# Patient Record
Sex: Female | Born: 1955 | Race: Black or African American | Hispanic: No | Marital: Single | Smoking: Never smoker
Health system: Southern US, Community
[De-identification: ages and names within clinical notes are randomized; demographics above are authoritative.]

## PROBLEM LIST (undated history)

## (undated) DIAGNOSIS — J45909 Unspecified asthma, uncomplicated: Secondary | ICD-10-CM

## (undated) DIAGNOSIS — E669 Obesity, unspecified: Secondary | ICD-10-CM

## (undated) DIAGNOSIS — I10 Essential (primary) hypertension: Secondary | ICD-10-CM

## (undated) HISTORY — PX: KNEE SURGERY: SHX244

## (undated) HISTORY — PX: OTHER SURGICAL HISTORY: SHX169

---

## 2015-10-29 ENCOUNTER — Encounter (HOSPITAL_COMMUNITY): Payer: Self-pay | Admitting: Emergency Medicine

## 2015-10-29 DIAGNOSIS — Z23 Encounter for immunization: Secondary | ICD-10-CM | POA: Insufficient documentation

## 2015-10-29 DIAGNOSIS — E669 Obesity, unspecified: Secondary | ICD-10-CM | POA: Insufficient documentation

## 2015-10-29 DIAGNOSIS — H6012 Cellulitis of left external ear: Secondary | ICD-10-CM | POA: Insufficient documentation

## 2015-10-29 DIAGNOSIS — I10 Essential (primary) hypertension: Secondary | ICD-10-CM | POA: Insufficient documentation

## 2015-10-29 DIAGNOSIS — J45909 Unspecified asthma, uncomplicated: Secondary | ICD-10-CM | POA: Insufficient documentation

## 2015-10-29 DIAGNOSIS — H6002 Abscess of left external ear: Secondary | ICD-10-CM | POA: Insufficient documentation

## 2015-10-29 NOTE — ED Notes (Signed)
Pt. reports left ear lobe redness/swelling with pain onset Sunday with no drainage , pt. stated ear pierced last Friday .

## 2015-10-30 ENCOUNTER — Emergency Department (HOSPITAL_COMMUNITY)
Admission: EM | Admit: 2015-10-30 | Discharge: 2015-10-30 | Disposition: A | Discharge status: Home / Self Care | Payer: Self-pay | Attending: Emergency Medicine | Admitting: Emergency Medicine

## 2015-10-30 DIAGNOSIS — L0291 Cutaneous abscess, unspecified: Secondary | ICD-10-CM

## 2015-10-30 DIAGNOSIS — L0390 Cellulitis, unspecified: Secondary | ICD-10-CM

## 2015-10-30 HISTORY — DX: Unspecified asthma, uncomplicated: J45.909

## 2015-10-30 HISTORY — DX: Obesity, unspecified: E66.9

## 2015-10-30 HISTORY — DX: Essential (primary) hypertension: I10

## 2015-10-30 MED ORDER — HYDROCODONE-ACETAMINOPHEN 5-325 MG PO TABS: 1.0000 | ORAL_TABLET | Freq: Four times a day (QID) | ORAL | Status: DC | PRN

## 2015-10-30 MED ORDER — CEPHALEXIN 500 MG PO CAPS: 500.0000 mg | ORAL_CAPSULE | Freq: Four times a day (QID) | ORAL | Status: DC

## 2015-10-30 MED ORDER — IBUPROFEN 800 MG PO TABS: 800.0000 mg | ORAL_TABLET | Freq: Three times a day (TID) | ORAL | Status: DC | PRN

## 2015-10-30 MED ORDER — TETANUS-DIPHTH-ACELL PERTUSSIS 5-2.5-18.5 LF-MCG/0.5 IM SUSP
0.5000 mL | Freq: Once | INTRAMUSCULAR | Status: AC
  Administered 2015-10-30: 0.5 mL via INTRAMUSCULAR
  Filled 2015-10-30: qty 0.5

## 2015-10-30 MED ORDER — CEPHALEXIN 250 MG PO CAPS
500.0000 mg | ORAL_CAPSULE | Freq: Once | ORAL | Status: AC
  Administered 2015-10-30: 500 mg via ORAL
  Filled 2015-10-30: qty 2

## 2015-10-30 MED ORDER — IBUPROFEN 800 MG PO TABS
800.0000 mg | ORAL_TABLET | Freq: Once | ORAL | Status: AC
  Administered 2015-10-30: 800 mg via ORAL
  Filled 2015-10-30: qty 1

## 2015-10-30 NOTE — ED Provider Notes (Signed)
TIME SEEN: 5:35 AM  CHIEF COMPLAINT: Left earlobe swelling and pain  HPI: Pt is a 60 y.o. female with history of hypertension and asthma who presents to the emergency department with left earlobe swelling for the past couple of days. He went to [facility] and had her ear pierced  6 days ago. States that the technician used sterile equipment. States that she has no sober the past 2-3 days at her earlobe has been swollen, red and painful. Denies any drainage. No fevers. She is not a diabetic immunocompromised. No pain inside the ear drainage in severe. No changes in her hearing. Pain and swelling is localized to the lobe around the area of the piercing. She has removed the piercing. States she is not sure when her last tetanus vaccination was.  ROS: See HPI Constitutional: no fever  Eyes: no drainage  ENT: no runny nose   Cardiovascular:  no chest pain  Resp: no SOB  GI: no vomiting GU: no dysuria Integumentary: no rash  Allergy: no hives  Musculoskeletal: no leg swelling  Neurological: no slurred speech ROS otherwise negative  PAST MEDICAL HISTORY/PAST SURGICAL HISTORY:  Past Medical History  Diagnosis Date  . Hypertension   . Asthma   . Obesity     MEDICATIONS:  Prior to Admission medications   Not on File    ALLERGIES:  No Known Allergies  SOCIAL HISTORY:  Social History  Substance Use Topics  . Smoking status: Never Smoker   . Smokeless tobacco: Not on file  . Alcohol Use: No    FAMILY HISTORY: No family history on file.  EXAM: BP 160/95 mmHg  Pulse 93  Temp(Src) 98.1 F (36.7 C) (Oral)  Resp 18  SpO2 100% CONSTITUTIONAL: Alert and oriented and responds appropriately to questions. Well-appearing; well-nourished HEAD: Normocephalic EYES: Conjunctivae clear, PERRL ENT: normal nose; no rhinorrhea; moist mucous membranes; patient has a swollen, erythematous and warm lobule of the auricle of the left ear with 3 piercings noted to the lobule.  The most medial  piercing does have yellow purulent drainage coming out of it. There is no fluctuance. No signs of mastoiditis. No drainage from the external auditory canal.  NECK: Supple, no meningismus, no LAD  CARD: RRR; S1 and S2 appreciated; no murmurs, no clicks, no rubs, no gallops RESP: Normal chest excursion without splinting or tachypnea; breath sounds clear and equal bilaterally; no wheezes, no rhonchi, no rales, no hypoxia or respiratory distress, speaking full sentences ABD/GI: Normal bowel sounds; non-distended; soft, non-tender, no rebound, no guarding, no peritoneal signs BACK:  The back appears normal and is non-tender to palpation, there is no CVA tenderness EXT: Normal ROM in all joints; non-tender to palpation; no edema; normal capillary refill; no cyanosis, no calf tenderness or swelling    SKIN: Normal color for age and race; warm; no rash NEURO: Moves all extremities equally, sensation to light touch intact diffusely, cranial nerves II through XII intact PSYCH: The patient's mood and manner are appropriate. Grooming and personal hygiene are appropriate.  MEDICAL DECISION MAKING: Patient here with infection to the lobule of the left ear. I was able to manually express a moderate amount of purulent drainage from the hole from the piercing in the left earlobe. She has had significant relief after this was performed. Have advised her to continue using warm compresses to this area as this was likely a small abscess and is now successfully drained. Will discharge on antibiotics for surrounding cellulitis.  We'll discharge with a short  course of pain medication as well. She does not have a PCP for follow-up. Have advised her of her swelling, redness or pain did not improve in next 48-72 hours she should return to the emergency department or urgent care for reevaluation. Discussed return precautions. She verbalized understanding and is comfortable with this plan. No sign of otitis externa, mastoiditis,  meningitis on exam. Doubt deep space neck infection.       [place], DO 10/30/15 [phone]

## 2015-10-30 NOTE — Discharge Instructions (Signed)

## 2021-02-24 ENCOUNTER — Institutional Professional Consult (permissible substitution): Payer: Self-pay | Admitting: Family Medicine

## 2021-04-28 ENCOUNTER — Encounter: Payer: Self-pay | Admitting: Family Medicine

## 2021-04-28 ENCOUNTER — Other Ambulatory Visit: Payer: Self-pay

## 2021-04-28 ENCOUNTER — Ambulatory Visit (INDEPENDENT_AMBULATORY_CARE_PROVIDER_SITE_OTHER): Payer: Medicare Other | Admitting: Family Medicine

## 2021-04-28 VITALS — BP 134/84 | HR 78 | Temp 98.5°F | Ht 62.5 in | Wt 319.2 lb

## 2021-04-28 DIAGNOSIS — Z9189 Other specified personal risk factors, not elsewhere classified: Secondary | ICD-10-CM

## 2021-04-28 DIAGNOSIS — L749 Eccrine sweat disorder, unspecified: Secondary | ICD-10-CM

## 2021-04-28 DIAGNOSIS — R600 Localized edema: Secondary | ICD-10-CM | POA: Diagnosis not present

## 2021-04-28 DIAGNOSIS — Z8249 Family history of ischemic heart disease and other diseases of the circulatory system: Secondary | ICD-10-CM

## 2021-04-28 DIAGNOSIS — Z1211 Encounter for screening for malignant neoplasm of colon: Secondary | ICD-10-CM

## 2021-04-28 DIAGNOSIS — G8929 Other chronic pain: Secondary | ICD-10-CM

## 2021-04-28 DIAGNOSIS — Z23 Encounter for immunization: Secondary | ICD-10-CM

## 2021-04-28 DIAGNOSIS — Z1231 Encounter for screening mammogram for malignant neoplasm of breast: Secondary | ICD-10-CM

## 2021-04-28 DIAGNOSIS — M25561 Pain in right knee: Secondary | ICD-10-CM

## 2021-04-28 DIAGNOSIS — R0600 Dyspnea, unspecified: Secondary | ICD-10-CM | POA: Diagnosis not present

## 2021-04-28 DIAGNOSIS — M25511 Pain in right shoulder: Secondary | ICD-10-CM

## 2021-04-28 DIAGNOSIS — R0609 Other forms of dyspnea: Secondary | ICD-10-CM

## 2021-04-28 DIAGNOSIS — M25562 Pain in left knee: Secondary | ICD-10-CM

## 2021-04-28 NOTE — Progress Notes (Signed)
Subjective:    Patient ID: Haley Stanley, female    DOB: [birth year], 65 y.o.   MRN: [id number]  HPI Chief Complaint  Patient presents with   other    New pt. Est. Rt. Knee arthritis and sweating with movement pt. Did agree to a flu shot today.    She is new to the practice and here publish care. Previous medical care: moved here from [place] 8 years ago.  Reports not having any medical care and more than 8 years.  Reports shortness of breath with walking and other activities.  This has been ongoing for at least 3 years but recently worsening. States she is not able to be active due to dyspnea. Denies chest pain, palpitations, cough, orthopnea, LE edema.  States her weight has been consistent and she has not lost any weight.  Admits that her diet is poor and that she overeats.  She also drinks soda.  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 Bursitis in left shoulder  Managing ok but does not walk often due to pain.    Reports sweating that is out of proportion to her activities.  Occasional night sweats.   Reports being told that she most likely has sleep apnea after one of her knee surgeries.  States they reported apnea.  She is not aware if she snores or not.  She does have daytime fatigue.  Denies ever having a sleep study.  No fever, chills, dizziness, abdominal pain, N/V/D, urinary symptoms.  Social history: Lives with her niece, single, is not working, worked with handicap adults in the past, HS graduate.  Denies smoking, drinking alcohol, drug use  Diet: unhealthy and drinks soda  Excerise: none   Immunizations: Fully vaccinated with boosters for Covid   Health maintenance:   Mammogram: 8-8 years ago  Colonoscopy: never  Last Gynecological Exam: many years ago    Reviewed allergies, medications, past medical, surgical, family, and social history.    Review of Systems Pertinent positives and negatives in the history of present illness.     Objective:   Physical  Exam BP 134/84   Pulse 78   Temp 98.5 F (36.9 C)   Ht 5' 2.5" (1.588 m)   Wt (!) 319 lb 3.2 oz (144.8 kg)   BMI 57.45 kg/m  Alert and oriented and in no distress.  Neck is supple without adenopathy. Cardiac exam shows a regular rhythm without murmurs or gallops. Lungs are clear to auscultation.  Respirations unlabored.  Lower extremities with a pronounced sock line, no pitting edema.  Skin is warm and dry.  Normal mood.   Epworth sleepiness scale 10     Assessment & Plan:  Dyspnea on exertion - Plan: CBC with Differential/Platelet, Comprehensive metabolic panel, EKG [id number], TSH, T4, free, T3, Lipid panel, Brain natriuretic peptide, DG Chest 2 View -She is not in any acute distress. EKG unremarkable and read by myself and Dr. Redmond School. Chest x-ray ordered. Discussed that her dyspnea may be related to severe obesity and deconditioning.  I will check labs and rule out any underlying etiology including a BNP to look for evidence of heart failure.  She does have a family history of heart failure.  Morbid obesity ([place]) - Plan: Hemoglobin A1c, TSH, T4, free, T3, Lipid panel, Brain natriuretic peptide, DG Chest 2 View, Home sleep test -In-depth counseling on improving her diet by cutting back on portion sizes, carbohydrates, soda in order to lose even a small amount of weight.  Discussed setting  a small goal such as 5 pounds and then when she meets that set another goal.  Discussed potential long-term health consequences associated with obesity. Follow-up pending lab results.  I also challenged her to lose 5 pounds and follow-up with me.  Bilateral leg edema - Plan: CBC with Differential/Platelet, Comprehensive metabolic panel, Brain natriuretic peptide, DG Chest 2 View -Nonpitting and most likely dependent.  Discussed increasing physical activity and walking more.  At increased risk for cardiovascular disease - Plan: EKG 12-Lead, Lipid panel, DG Chest 2 View -Discussed risk factors for heart  disease.  Follow-up pending chest x-ray and lab results.  At risk for sleep apnea - Plan: Home sleep test -She is at high risk for sleep apnea and has been told in the past that she had periods of apnea by healthcare workers during her knee surgery.  Epworth Sleepiness Scale of 10  Maternal family history of congestive heart failure - Plan: Brain natriuretic peptide, DG Chest 2 View  Sweating abnormality - Plan: TSH, T4, free, T3 -Check labs and follow-up  Chronic pain of both knees -Discussed even a small amount of weight loss will help improve knee pain.  History of arthritis.  Follow-up as needed.  Consider imaging or referral to Ortho as needed.  Chronic right shoulder pain -Continue symptomatic treatment.  Follow-up if worsening  Encounter for screening mammogram for malignant neoplasm of breast - Plan: MM DIGITAL SCREENING BILATERAL -Denies having a mammogram for at least 10 years and this would have been in [place].  Needs flu shot - Plan: Flu Vaccine QUAD High Dose(Fluad) -Counseling on potential side effects  Screen for colon cancer - Plan: Ambulatory referral to Gastroenterology -Denies ever having a colonoscopy.  Referral made

## 2021-04-28 NOTE — Patient Instructions (Signed)
Go to [facility] for a chest X ray.   I am referring you for a sleep test to screen for sleep apnea.   You will receive a call from [facility] to schedule a visit to discuss colon cancer screening.   Call [facility] and schedule your mammogram.   Cut back on soda, sweets, carbohydrates such as potatoes, pasta, rice, bread and sugar.   Cut back on portion sizes.   Avoid eating before bedtime.   We will be in touch with your lab results.

## 2021-04-29 ENCOUNTER — Encounter: Payer: Self-pay | Admitting: Family Medicine

## 2021-04-29 DIAGNOSIS — E785 Hyperlipidemia, unspecified: Secondary | ICD-10-CM | POA: Insufficient documentation

## 2021-04-29 DIAGNOSIS — E038 Other specified hypothyroidism: Secondary | ICD-10-CM | POA: Insufficient documentation

## 2021-04-29 DIAGNOSIS — E1169 Type 2 diabetes mellitus with other specified complication: Secondary | ICD-10-CM | POA: Insufficient documentation

## 2021-04-29 DIAGNOSIS — E119 Type 2 diabetes mellitus without complications: Secondary | ICD-10-CM | POA: Insufficient documentation

## 2021-04-29 HISTORY — DX: Other specified hypothyroidism: E03.8

## 2021-04-29 HISTORY — DX: Type 2 diabetes mellitus with other specified complication: E11.69

## 2021-04-29 HISTORY — DX: Type 2 diabetes mellitus without complications: E11.9

## 2021-04-29 LAB — CBC WITH DIFFERENTIAL/PLATELET
Basophils Absolute: 0.1 10*3/uL (ref 0.0–0.2)
Basos: 1 %
EOS (ABSOLUTE): 0.2 10*3/uL (ref 0.0–0.4)
Eos: 2 %
Hematocrit: 41.4 % (ref 34.0–46.6)
Hemoglobin: 14.7 g/dL (ref 11.1–15.9)
Immature Grans (Abs): 0 10*3/uL (ref 0.0–0.1)
Immature Granulocytes: 0 %
Lymphocytes Absolute: 3.1 10*3/uL (ref 0.7–3.1)
Lymphs: 34 %
MCH: 29.9 pg (ref 26.6–33.0)
MCHC: 35.5 g/dL (ref 31.5–35.7)
MCV: 84 fL (ref 79–97)
Monocytes Absolute: 0.4 10*3/uL (ref 0.1–0.9)
Monocytes: 4 %
Neutrophils Absolute: 5.6 10*3/uL (ref 1.4–7.0)
Neutrophils: 59 %
Platelets: 402 10*3/uL (ref 150–450)
RBC: 4.92 x10E6/uL (ref 3.77–5.28)
RDW: 13.1 % (ref 11.7–15.4)
WBC: 9.3 10*3/uL (ref 3.4–10.8)

## 2021-04-29 LAB — TSH: TSH: 4.54 u[IU]/mL — ABNORMAL HIGH (ref 0.450–4.500)

## 2021-04-29 LAB — LIPID PANEL
Chol/HDL Ratio: 3.4 ratio (ref 0.0–4.4)
Cholesterol, Total: 224 mg/dL — ABNORMAL HIGH (ref 100–199)
HDL: 65 mg/dL (ref >39)
LDL Chol Calc (NIH): 117 mg/dL — ABNORMAL HIGH (ref 0–99)
Triglycerides: 241 mg/dL — ABNORMAL HIGH (ref 0–149)
VLDL Cholesterol Cal: 42 mg/dL — ABNORMAL HIGH (ref 5–40)

## 2021-04-29 LAB — COMPREHENSIVE METABOLIC PANEL
ALT: 9 IU/L (ref 0–32)
AST: 12 IU/L (ref 0–40)
Albumin/Globulin Ratio: 1.3 (ref 1.2–2.2)
Albumin: 4.4 g/dL (ref 3.8–4.8)
Alkaline Phosphatase: 117 IU/L (ref 44–121)
BUN/Creatinine Ratio: 15 (ref 12–28)
BUN: 13 mg/dL (ref 8–27)
Bilirubin Total: 0.5 mg/dL (ref 0.0–1.2)
CO2: 20 mmol/L (ref 20–29)
Calcium: 9.9 mg/dL (ref 8.7–10.3)
Chloride: 100 mmol/L (ref 96–106)
Creatinine, Ser: 0.89 mg/dL (ref 0.57–1.00)
Globulin, Total: 3.4 g/dL (ref 1.5–4.5)
Glucose: 113 mg/dL — ABNORMAL HIGH (ref 65–99)
Potassium: 4.4 mmol/L (ref 3.5–5.2)
Sodium: 138 mmol/L (ref 134–144)
Total Protein: 7.8 g/dL (ref 6.0–8.5)
eGFR: 72 mL/min/{1.73_m2} (ref >59)

## 2021-04-29 LAB — HEMOGLOBIN A1C
Est. average glucose Bld gHb Est-mCnc: 151 mg/dL
Hgb A1c MFr Bld: 6.9 % — ABNORMAL HIGH (ref 4.8–5.6)

## 2021-04-29 LAB — T4, FREE: Free T4: 1.04 ng/dL (ref 0.82–1.77)

## 2021-04-29 LAB — BRAIN NATRIURETIC PEPTIDE: BNP: 10.3 pg/mL (ref 0.0–100.0)

## 2021-04-29 LAB — T3: T3, Total: 111 ng/dL (ref 71–180)

## 2021-04-29 NOTE — Progress Notes (Signed)
Let her know that she has diabetes and cholesterol issues. Follow up with me next week for a 30 minute visit to discuss these conditions and start treatment. In the meantime, cut back on sugar, soda, potatoes, bread, pasta, rice, etc. Avoid fried food.

## 2021-05-04 ENCOUNTER — Ambulatory Visit
Admission: RE | Admit: 2021-05-04 | Discharge: 2021-05-04 | Disposition: A | Discharge status: Home / Self Care | Payer: Medicare Other | Source: Ambulatory Visit | Attending: Family Medicine | Admitting: Family Medicine

## 2021-05-04 DIAGNOSIS — Z85830 Personal history of malignant neoplasm of bone: Secondary | ICD-10-CM | POA: Diagnosis not present

## 2021-05-04 DIAGNOSIS — Z8249 Family history of ischemic heart disease and other diseases of the circulatory system: Secondary | ICD-10-CM

## 2021-05-04 DIAGNOSIS — R600 Localized edema: Secondary | ICD-10-CM

## 2021-05-04 DIAGNOSIS — R0609 Other forms of dyspnea: Secondary | ICD-10-CM | POA: Diagnosis not present

## 2021-05-04 DIAGNOSIS — R0600 Dyspnea, unspecified: Secondary | ICD-10-CM

## 2021-05-04 DIAGNOSIS — Z9189 Other specified personal risk factors, not elsewhere classified: Secondary | ICD-10-CM

## 2021-05-04 NOTE — Progress Notes (Signed)
Pt has OV tomorrow at 11 am

## 2021-05-04 NOTE — Progress Notes (Signed)
Please call her to come in to discuss all of her results.

## 2021-05-05 ENCOUNTER — Other Ambulatory Visit: Payer: Self-pay

## 2021-05-05 ENCOUNTER — Encounter: Payer: Self-pay | Admitting: Family Medicine

## 2021-05-05 ENCOUNTER — Ambulatory Visit
Admission: RE | Admit: 2021-05-05 | Discharge: 2021-05-05 | Disposition: A | Discharge status: Home / Self Care | Payer: Medicare Other | Source: Ambulatory Visit | Attending: Family Medicine | Admitting: Family Medicine

## 2021-05-05 ENCOUNTER — Ambulatory Visit (INDEPENDENT_AMBULATORY_CARE_PROVIDER_SITE_OTHER): Payer: Medicare Other | Admitting: Family Medicine

## 2021-05-05 VITALS — BP 146/98 | HR 86 | Temp 97.5°F | Ht 62.0 in | Wt 319.6 lb

## 2021-05-05 DIAGNOSIS — M25511 Pain in right shoulder: Secondary | ICD-10-CM

## 2021-05-05 DIAGNOSIS — Z23 Encounter for immunization: Secondary | ICD-10-CM

## 2021-05-05 DIAGNOSIS — G8929 Other chronic pain: Secondary | ICD-10-CM | POA: Insufficient documentation

## 2021-05-05 DIAGNOSIS — E038 Other specified hypothyroidism: Secondary | ICD-10-CM | POA: Diagnosis not present

## 2021-05-05 DIAGNOSIS — E1169 Type 2 diabetes mellitus with other specified complication: Secondary | ICD-10-CM | POA: Diagnosis not present

## 2021-05-05 DIAGNOSIS — M19011 Primary osteoarthritis, right shoulder: Secondary | ICD-10-CM | POA: Diagnosis not present

## 2021-05-05 DIAGNOSIS — Z1211 Encounter for screening for malignant neoplasm of colon: Secondary | ICD-10-CM

## 2021-05-05 DIAGNOSIS — M545 Low back pain, unspecified: Secondary | ICD-10-CM

## 2021-05-05 DIAGNOSIS — R0600 Dyspnea, unspecified: Secondary | ICD-10-CM

## 2021-05-05 DIAGNOSIS — M899 Disorder of bone, unspecified: Secondary | ICD-10-CM | POA: Diagnosis not present

## 2021-05-05 DIAGNOSIS — E785 Hyperlipidemia, unspecified: Secondary | ICD-10-CM

## 2021-05-05 DIAGNOSIS — E119 Type 2 diabetes mellitus without complications: Secondary | ICD-10-CM | POA: Diagnosis not present

## 2021-05-05 DIAGNOSIS — R0609 Other forms of dyspnea: Secondary | ICD-10-CM

## 2021-05-05 HISTORY — DX: Pain in right shoulder: M25.511

## 2021-05-05 MED ORDER — METFORMIN HCL 500 MG PO TABS: 500.0000 mg | ORAL_TABLET | Freq: Every day | ORAL | 1 refills | Status: DC

## 2021-05-05 MED ORDER — TRAMADOL HCL 50 MG PO TABS: 50.0000 mg | ORAL_TABLET | Freq: Three times a day (TID) | ORAL | 0 refills | Status: AC | PRN

## 2021-05-05 MED ORDER — ATORVASTATIN CALCIUM 10 MG PO TABS: 10.0000 mg | ORAL_TABLET | Freq: Every day | ORAL | 2 refills | Status: DC

## 2021-05-05 NOTE — Progress Notes (Signed)
Subjective:    Patient ID: Haley Stanley, female    DOB: 1956/03/03, 65 y.o.   MRN: [id number]  HPI Chief Complaint  Patient presents with   Follow-up    Discuss results and treatment options   This is her second visit with me, she is fairly new to the practice. Here today to review recent lab results and chest x-ray results.  Discussed that she has multiple lesions on her thoracic spine per chest x-ray.  Discussed that this is suggestive of cancer and metastases.  She has not had any regular medical care for the past 8 to 10 years. Denies having cancer screenings including colon, breast, cervical. She has never had a colonoscopy.  Discussed that her hemoglobin A1c is 6.9% and she does have diabetes.  This is new.  Her cholesterol is also elevated. TSH mildly elevated with a normal free T4 and T3.  She continues having dyspnea on exertion.  No sign of heart failure with normal BNP.  She also reports low back pain and fairly severe right shoulder pain with limited range of motion. No known injury. States she has not been able to sleep due to her shoulder pain.  Denies numbness, tingling or weakness.  No fever, chills, night sweats, unexplained weight loss, chest pain, palpitations, cough, abdominal pain, nausea, vomiting, diarrhea or urinary symptoms.   Review of Systems Pertinent positives and negatives in the history of present illness.     Objective:   Physical Exam BP (!) 146/98 (BP Location: Left Arm, Patient Position: Sitting, Cuff Size: Large)   Pulse 86   Temp (!) 97.5 F (36.4 C) (Oral)   Ht '5\' 2"'$  (1.575 m)   Wt (!) 319 lb 9.6 oz (145 kg)   SpO2 98%   BMI 58.46 kg/m   Alert and oriented and in no acute distress.  Cardiac exam shows a regular rhythm without murmurs or gallops. Lungs are clear to auscultation.  Extremities without edema.  Right shoulder with limited range of motion.  AC joint tender throughout.  Negative drop arm.  Right upper extremity is  neurovascularly intact.  Skin is warm and dry.       Assessment & Plan:  Lesion of bone of thoracic spine - Plan: Ambulatory referral to Gastroenterology, traMADol (ULTRAM) 50 MG tablet, Ambulatory referral to Hematology / Oncology -Unclear as to whether her back pain may be related to lesions of her spine.  She has been taking Tylenol without much relief.  I will hold off on NSAIDs until we find out more information regarding cancer metastases.  Tramadol prescribed.  Discussed the sedating nature of this medication and safety precautions.  I also discussed that this medication is a controlled substance and that she should only use it as needed for moderate to severe pain. Discussed updating her cancer screenings including breast, cervical and colon.  She declines Pap smear today.  She will call and schedule her mammogram.  I will also refer her for her for screening colonoscopy. Referral to oncology.  Diabetes mellitus, new onset ([place]) - Plan: metFORMIN (GLUCOPHAGE) 500 MG tablet -Counseling on diabetes including controlling blood sugars with diet.  She is unable to exercise due to back pain and DOE.  She declines checking blood sugars at home for now.  She will start on low-dose metformin once daily.  Follow-up in 3 months or sooner if needed.  Hyperlipidemia associated with type 2 diabetes mellitus ([place]) - Plan: atorvastatin (LIPITOR) 10 MG tablet -Discussed increased risk for  heart disease due to diabetes and hyperlipidemia.  Recommend low-fat diet and starting on atorvastatin.  Subclinical hypothyroidism -Continue to monitor.  Acute pain of right shoulder - Plan: DG Shoulder Right, traMADol (ULTRAM) 50 MG tablet -Unable to perform complete exam due to pain and limited range of motion.  I will send her for an x-ray.  Discussed using a topical analgesic, ice or heat and tramadol prescribed for moderate to severe pain and she is aware that the medication is sedating.  Screen for colon cancer  - Plan: Ambulatory referral to Gastroenterology -Referral to Dr. Benson Norway.   Chronic bilateral low back pain without sciatica - Plan: traMADol (ULTRAM) 50 MG tablet, Ambulatory referral to Hematology / Oncology  DOE (dyspnea on exertion) - Plan: Ambulatory referral to Hematology / Oncology -Appears euvolemic.  DOE apparently has been ongoing for approximately 3 years.  Need for vaccination against Streptococcus pneumoniae -Discussed need for updating vaccines.

## 2021-05-05 NOTE — Patient Instructions (Addendum)
You will hear from [facility].   You will also hear from Dr. Jossie Ng office for colon cancer screening.   Call and schedule your mammogram at [facility].   Go to [facility] for your shoulder X ray.   You may continue taking Tylenol and use ice or heat for pain. I also prescribed Tramadol for pain at bedtime. Avoid alcohol, driving., this medication is sedating.   Start metformin and atorvastatin once daily with breakfast.

## 2021-05-06 ENCOUNTER — Telehealth: Payer: Self-pay | Admitting: Hematology

## 2021-05-06 NOTE — Progress Notes (Signed)
Please call and let her know that her shoulder x-ray shows degenerative changes in the shoulder joint.  If she getting any relief from the medication or the recommendations I made?  We can always refer her to an orthopedist if she would like.  If she prefers this option, please refer her to Ortho care.  Also, please ask her if she has heard from [facility] cancer center?

## 2021-05-06 NOTE — Telephone Encounter (Signed)
Scheduled appt per 9/13 referral. Pt is aware of appt date and time.  

## 2021-05-08 ENCOUNTER — Ambulatory Visit
Admission: RE | Admit: 2021-05-08 | Discharge: 2021-05-08 | Disposition: A | Discharge status: Home / Self Care | Payer: Medicare Other | Source: Ambulatory Visit | Attending: Family Medicine | Admitting: Family Medicine

## 2021-05-08 ENCOUNTER — Other Ambulatory Visit: Payer: Self-pay

## 2021-05-08 DIAGNOSIS — Z1231 Encounter for screening mammogram for malignant neoplasm of breast: Secondary | ICD-10-CM | POA: Diagnosis not present

## 2021-05-11 ENCOUNTER — Other Ambulatory Visit: Payer: Self-pay

## 2021-05-11 ENCOUNTER — Inpatient Hospital Stay: Payer: Medicare Other | Attending: Hematology | Admitting: Hematology

## 2021-05-11 ENCOUNTER — Inpatient Hospital Stay: Payer: Medicare Other

## 2021-05-11 VITALS — BP 191/97 | HR 87 | Temp 98.4°F | Resp 18 | Wt 314.7 lb

## 2021-05-11 DIAGNOSIS — C801 Malignant (primary) neoplasm, unspecified: Secondary | ICD-10-CM | POA: Diagnosis not present

## 2021-05-11 DIAGNOSIS — Z8249 Family history of ischemic heart disease and other diseases of the circulatory system: Secondary | ICD-10-CM | POA: Diagnosis not present

## 2021-05-11 DIAGNOSIS — E038 Other specified hypothyroidism: Secondary | ICD-10-CM | POA: Insufficient documentation

## 2021-05-11 DIAGNOSIS — J45909 Unspecified asthma, uncomplicated: Secondary | ICD-10-CM | POA: Diagnosis not present

## 2021-05-11 DIAGNOSIS — Z803 Family history of malignant neoplasm of breast: Secondary | ICD-10-CM | POA: Diagnosis not present

## 2021-05-11 DIAGNOSIS — C7951 Secondary malignant neoplasm of bone: Secondary | ICD-10-CM | POA: Diagnosis not present

## 2021-05-11 DIAGNOSIS — E669 Obesity, unspecified: Secondary | ICD-10-CM | POA: Insufficient documentation

## 2021-05-11 DIAGNOSIS — E785 Hyperlipidemia, unspecified: Secondary | ICD-10-CM | POA: Insufficient documentation

## 2021-05-11 DIAGNOSIS — E119 Type 2 diabetes mellitus without complications: Secondary | ICD-10-CM | POA: Insufficient documentation

## 2021-05-11 DIAGNOSIS — I10 Essential (primary) hypertension: Secondary | ICD-10-CM | POA: Insufficient documentation

## 2021-05-11 LAB — CBC WITH DIFFERENTIAL/PLATELET
Abs Immature Granulocytes: 0.03 10*3/uL (ref 0.00–0.07)
Basophils Absolute: 0.1 10*3/uL (ref 0.0–0.1)
Basophils Relative: 1 %
Eosinophils Absolute: 0.2 10*3/uL (ref 0.0–0.5)
Eosinophils Relative: 2 %
HCT: 40.3 % (ref 36.0–46.0)
Hemoglobin: 13.5 g/dL (ref 12.0–15.0)
Immature Granulocytes: 0 %
Lymphocytes Relative: 29 %
Lymphs Abs: 2.5 10*3/uL (ref 0.7–4.0)
MCH: 29.2 pg (ref 26.0–34.0)
MCHC: 33.5 g/dL (ref 30.0–36.0)
MCV: 87 fL (ref 80.0–100.0)
Monocytes Absolute: 0.3 10*3/uL (ref 0.1–1.0)
Monocytes Relative: 4 %
Neutro Abs: 5.6 10*3/uL (ref 1.7–7.7)
Neutrophils Relative %: 64 %
Platelets: 361 10*3/uL (ref 150–400)
RBC: 4.63 MIL/uL (ref 3.87–5.11)
RDW: 13.7 % (ref 11.5–15.5)
WBC: 8.6 10*3/uL (ref 4.0–10.5)
nRBC: 0 % (ref 0.0–0.2)

## 2021-05-11 LAB — CMP (CANCER CENTER ONLY)
ALT: 9 U/L (ref 0–44)
AST: 12 U/L — ABNORMAL LOW (ref 15–41)
Albumin: 3.7 g/dL (ref 3.5–5.0)
Alkaline Phosphatase: 97 U/L (ref 38–126)
Anion gap: 11 (ref 5–15)
BUN: 10 mg/dL (ref 8–23)
CO2: 23 mmol/L (ref 22–32)
Calcium: 9.8 mg/dL (ref 8.9–10.3)
Chloride: 106 mmol/L (ref 98–111)
Creatinine: 0.86 mg/dL (ref 0.44–1.00)
GFR, Estimated: >60 mL/min (ref >60)
Glucose, Bld: 104 mg/dL — ABNORMAL HIGH (ref 70–99)
Potassium: 3.8 mmol/L (ref 3.5–5.1)
Sodium: 140 mmol/L (ref 135–145)
Total Bilirubin: 0.7 mg/dL (ref 0.3–1.2)
Total Protein: 8.4 g/dL — ABNORMAL HIGH (ref 6.5–8.1)

## 2021-05-11 LAB — VITAMIN D 25 HYDROXY (VIT D DEFICIENCY, FRACTURES): Vit D, 25-Hydroxy: 15.74 ng/mL — ABNORMAL LOW (ref 30–100)

## 2021-05-11 LAB — LACTATE DEHYDROGENASE: LDH: 177 U/L (ref 98–192)

## 2021-05-11 NOTE — Patient Instructions (Signed)
Thank you for choosing [facility] to provide your care.   Should you have questions after your visit to the [facility] ([facility]), please contact this office at [phone] between 8:30 AM and 4:30 PM.  Voice mails left after 4:00 PM may not be returned until the following business day.  Calls received after 4:30 PM will be answered by an off-site Nurse Triage Line.    Prescription Refills:  Please have your pharmacy contact us directly for most prescription requests.  Contact the office directly for refills of narcotics (pain medications). Allow 48-72 hours for refills.  Appointments: Please contact the [facility] scheduling department [phone] for questions regarding [facility] appointment scheduling.  Contact the schedulers with any scheduling changes so that your appointment can be rescheduled in a timely manner.   Central Scheduling for [place] [phone] - Call to schedule procedures such as PET scans, CT scans, MRI, Ultrasound, etc.  To afford each patient quality time with our providers, please arrive 30 minutes before your scheduled appointment time.  If you arrive late for your appointment, you may be asked to reschedule.  We strive to give you quality time with our providers, and arriving late affects you and other patients whose appointments are after yours. If you are a no show for multiple scheduled visits, you may be dismissed from the clinic at the providers discretion.     Resources: [facility] Social Workers [phone] for additional information on assistance programs or assistance connecting with community support programs   [place] DSS  336-641-3447: Information regarding food stamps, Medicaid, and utility assistance [place] 336-333-6589   [place] [employer]'s shared-ride transportation service for eligible riders who have a disability that prevents them from riding the fixed route bus.   Medicare Rights Center 800-333-4114  Helps people with Medicare understand their rights and benefits, navigate the Medicare system, and secure the quality healthcare they deserve [employer] 800-227-2345 Assists patients locate various types of support and financial assistance Cancer Care: 1-800-813-HOPE (4673) Provides financial assistance, online support groups, medication/co-pay assistance.   Transportation Assistance for appointments at [facility]: Transportation Coordinator [phone]  Again, thank you for choosing [facility] for your care.       

## 2021-05-11 NOTE — Progress Notes (Addendum)
Marland Kitchen   HEMATOLOGY/ONCOLOGY CONSULTATION NOTE  Date of Service: 05/11/2021  Patient Care Team: Girtha Rm, NP-C as PCP - General (Family Medicine)  CHIEF COMPLAINTS/PURPOSE OF CONSULTATION:  Multiple lytic lesions of the thoracic spine on chest x-ray  HISTORY OF PRESENTING ILLNESS:   Haley Stanley is a wonderful 65 y.o. female who has been referred to [place] by Dr .Raenette Rover, Laurian Brim, NP-C  for evaluation and management of incidentally noted multiple lytic lesions of the thoracic spine noted on a chest x-ray that was done on 05/04/2021 by her primary care physician to work-up some dyspnea on exertion.  Patient notes she has a history of hypertension, diabetes, dyslipidemia, obesity, asthma and has been having some dyspnea on exertion for the last few months.  No overt chest pain or shortness of breath. Has not had any sudden weight loss. No fevers no chills no night sweats. Denies any obvious new focal bone pains in her spine or other bones. Denies any recent injury to her back. No other obvious new focal symptoms.  Recent mammogram done on 05/08/2021 the results of these were not available at the time of the clinic visit. Patient notes she has never had a colonoscopy. Available labs on 04/28/2021 showed normal CBC and CMP Hemoglobin A1c 6.9. TSH 4.54 with a normal free T4 of 1.04  BNP was within normal limits at 10.3 MEDICAL HISTORY:  Past Medical History:  Diagnosis Date   Asthma    Diabetes mellitus, new onset ([place]) 04/29/2021   Hyperlipidemia associated with type 2 diabetes mellitus ([place]) 04/29/2021   Hypertension    Obesity    Subclinical hypothyroidism 04/29/2021  .Body mass index is 57.56 kg/m.   SURGICAL HISTORY: Past Surgical History:  Procedure Laterality Date   gallstone removal     KNEE SURGERY    LN removal at age 14 yrs   SOCIAL HISTORY: Social History   Socioeconomic History   Marital status: Single    Spouse name: Not on file   Number of children: Not on file    Years of education: Not on file   Highest education level: Not on file  Occupational History   Not on file  Tobacco Use   Smoking status: Never   Smokeless tobacco: Never  Substance and Sexual Activity   Alcohol use: No   Drug use: No   Sexual activity: Not on file  Other Topics Concern   Not on file  Social History Narrative   Not on file   Social Determinants of Health   Financial Resource Strain: Not on file  Food Insecurity: Not on file  Transportation Needs: Not on file  Physical Activity: Not on file  Stress: Not on file  Social Connections: Not on file  Intimate Partner Violence: Not on file  Never smoker No ETOH No other recreational drugs  FAMILY HISTORY: Family History  Problem Relation Age of Onset   Heart failure Mother    Breast cancer Neg Hx   MGM - breast cancer Mother - CHF Daughter - HTN, anemia  ALLERGIES:  has No Known Allergies.  MEDICATIONS:  Current Outpatient Medications  Medication Sig Dispense Refill   atorvastatin (LIPITOR) 10 MG tablet Take 1 tablet (10 mg total) by mouth daily. 30 tablet 2   metFORMIN (GLUCOPHAGE) 500 MG tablet Take 1 tablet (500 mg total) by mouth daily with breakfast. 90 tablet 1   traMADol (ULTRAM) 50 MG tablet Take by mouth every 6 (six) hours as needed.  ibuprofen (ADVIL,MOTRIN) 800 MG tablet Take 1 tablet (800 mg total) by mouth every 8 (eight) hours as needed for mild pain. 30 tablet 0   No current facility-administered medications for this visit.    REVIEW OF SYSTEMS:    .10 Point review of Systems was done is negative except as noted above.  PHYSICAL EXAMINATION: ECOG PERFORMANCE STATUS: 1 - Symptomatic but completely ambulatory  . Vitals:   05/11/21 1149  BP: (!) 191/97  Pulse: 87  Resp: 18  Temp: 98.4 F (36.9 C)  SpO2: 99%   Filed Weights   05/11/21 1149  Weight: (!) 314 lb 11.2 oz (142.7 kg)   .Body mass index is 57.56 kg/m.  GENERAL:alert, in no acute distress and  comfortable SKIN: no acute rashes, no significant lesions EYES: conjunctiva are pink and non-injected, sclera anicteric OROPHARYNX: MMM, no exudates, no oropharyngeal erythema or ulceration NECK: supple, no JVD LYMPH:  no palpable lymphadenopathy in the cervical, axillary or inguinal regions LUNGS: clear to auscultation b/l with normal respiratory effort HEART: regular rate & rhythm ABDOMEN:  normoactive bowel sounds , non tender, not distended. Extremity: no pedal edema PSYCH: alert & oriented x 3 with fluent speech NEURO: no focal motor/sensory deficits  LABORATORY DATA:  I have reviewed the data as listed  . CBC Latest Ref Rng & Units 04/28/2021  WBC 3.4 - 10.8 x10E3/uL 9.3  Hemoglobin 11.1 - 15.9 g/dL 14.7  Hematocrit 34.0 - 46.6 % 41.4  Platelets 150 - 450 x10E3/uL 402    . CMP Latest Ref Rng & Units 04/28/2021  Glucose 65 - 99 mg/dL 113(H)  BUN 8 - 27 mg/dL 13  Creatinine 0.57 - 1.00 mg/dL 0.89  Sodium 134 - 144 mmol/L 138  Potassium 3.5 - 5.2 mmol/L 4.4  Chloride 96 - 106 mmol/L 100  CO2 20 - 29 mmol/L 20  Calcium 8.7 - 10.3 mg/dL 9.9  Total Protein 6.0 - 8.5 g/dL 7.8  Total Bilirubin 0.0 - 1.2 mg/dL 0.5  Alkaline Phos 44 - 121 IU/L 117  AST 0 - 40 IU/L 12  ALT 0 - 32 IU/L 9     RADIOGRAPHIC STUDIES: I have personally reviewed the radiological images as listed and agreed with the findings in the report. DG Chest 2 View  Result Date: 05/04/2021 CLINICAL DATA:  65 year old female with a history of dyspnea on exertion EXAM: CHEST - 2 VIEW COMPARISON:  None. FINDINGS: Cardiomediastinal silhouette within normal limits in size and contour. No evidence of central vascular congestion. No interlobular septal thickening. No pneumothorax or pleural effusion. Coarsened interstitial markings, with no confluent airspace disease. No acute displaced fracture. Degenerative changes of the spine. Lateral view demonstrates multiple lytic lesions of the thoracic spine. IMPRESSION:  Negative for acute cardiopulmonary disease. Multiple lytic lesions of the thoracic spine are implicated, which can be seen with multiple myeloma/metastatic disease. Further evaluation with thoracic spine CT may be useful, as well as consideration of referral to hematology/oncology. Electronically Signed   By: Corrie Mckusick D.O.   On: 05/04/2021 14:16   DG Shoulder Right  Result Date: 05/06/2021 CLINICAL DATA:  Severe right shoulder pain for 2 weeks EXAM: RIGHT SHOULDER - 2+ VIEW COMPARISON:  None. FINDINGS: There is no acute fracture or dislocation. Glenohumeral and acromioclavicular alignment is maintained. There is mild glenohumeral and acromioclavicular joint space narrowing with associated subchondral sclerosis and osteophytosis about the acromioclavicular joint. The soft tissues are unremarkable. IMPRESSION: 1. No acute fracture or dislocation. 2. Degenerative changes about the glenohumeral  and acromioclavicular joints as above, worse at the [facility] joint. Electronically Signed   By: Valetta Mole M.D.   On: 05/06/2021 13:42    ASSESSMENT & PLAN:   65 year old female with history of hypertension, diabetes, dyslipidemia with shortness of breath on exertion for the last few months with  #1 Multiple lytic lesions noted on thoracic spine on chest x-ray. Patient does not have overt symptoms from this.  These were incidentally noted on imaging. Unable to differentiate whether she is having true dyspnea on exertion versus fatigue. Plan -Patient has had a mammogram on 05/08/2021 the reading of this is still pending as of the clinic visit. -Will order lab work including to rule out multiple myeloma and tumor markers for breast cancer and other labs as noted below. -MRI of the thoracic spine to evaluate lesions noted on chest x-ray in more detail. -PET CT scan to evaluate for primary lesion that could be causing bone metastases and also to get a comprehensive look at the extent of her bone lesions and other  possible metastatic disease to guide appropriate lesion for CT-guided biopsy. -We will check vitamin D levels to rule out metabolic bone disease.   # 2  Past Medical History:  Diagnosis Date   Asthma    Diabetes mellitus, new onset ([place]) 04/29/2021   Hyperlipidemia associated with type 2 diabetes mellitus ([place]) 04/29/2021   Hypertension    Obesity    Subclinical hypothyroidism 04/29/2021   -Follow-up with primary care physician for management further chronic medical issues.   . Orders Placed This Encounter  Procedures   MR THORACIC SPINE W WO CONTRAST    Standing Status:   Future    Standing Expiration Date:   05/11/2022    Order Specific Question:   GRA to provide read?    Answer:   Yes    Order Specific Question:   If indicated for the ordered procedure, I authorize the administration of contrast media per Radiology protocol    Answer:   Yes    Order Specific Question:   What is the patient's sedation requirement?    Answer:   No Sedation    Order Specific Question:   Use SRS Protocol?    Answer:   No    Order Specific Question:   Does the patient have a pacemaker or implanted devices?    Answer:   No    Order Specific Question:   Preferred imaging location?    Answer:   [facility] (table limit - 550 lbs)   NM PET Image Initial (PI) Whole Body    Standing Status:   Future    Standing Expiration Date:   05/11/2022    Order Specific Question:   If indicated for the ordered procedure, I authorize the administration of a radiopharmaceutical per Radiology protocol    Answer:   Yes    Order Specific Question:   Preferred imaging location?    Answer:   [facility]   CBC with Differential/Platelet    Standing Status:   Future    Number of Occurrences:   1    Standing Expiration Date:   05/11/2022   CMP ([place] only)    Standing Status:   Future    Number of Occurrences:   1    Standing Expiration Date:   05/11/2022   Lactate dehydrogenase    Standing Status:    Future    Number of Occurrences:   1    Standing  Expiration Date:   05/11/2022   Multiple Myeloma Panel (SPEP&IFE w/QIG)    Standing Status:   Future    Number of Occurrences:   1    Standing Expiration Date:   05/11/2022   Kappa/lambda light chains    Standing Status:   Future    Number of Occurrences:   1    Standing Expiration Date:   05/11/2022   Cancer antigen 15-3    Standing Status:   Future    Number of Occurrences:   1    Standing Expiration Date:   05/11/2022   Cancer antigen 27.29    Standing Status:   Future    Number of Occurrences:   1    Standing Expiration Date:   05/11/2022   Vitamin D 25 hydroxy    Standing Status:   Future    Number of Occurrences:   1    Standing Expiration Date:   05/11/2022     All of the patients questions were answered with apparent satisfaction. The patient knows to call the clinic with any problems, questions or concerns.  . The total time spent in the appointment was 60 minutes and more than 50% was on counseling and direct patient cares.    Sullivan Lone MD [place] AAHIVMS [facility] Hematology/Oncology Physician [facility]  (Office):       [phone] (Work cell):  [phone] (Fax):           [phone]  05/11/2021 12:13 PM

## 2021-05-11 NOTE — Progress Notes (Deleted)
HEMATOLOGY/ONCOLOGY CONSULTATION NOTE  Date of Service: 05/11/2021  Patient Care Team: Girtha Rm, NP-C as PCP - General (Family Medicine)  CHIEF COMPLAINTS/PURPOSE OF CONSULTATION:    HISTORY OF PRESENTING ILLNESS:   Haley Stanley is a wonderful 65 y.o. female who has been referred to [place] by Dr Merryl Hacker for evaluation and management of   MEDICAL HISTORY:  Past Medical History:  Diagnosis Date   Asthma    Diabetes mellitus, new onset ([place]) 04/29/2021   Hyperlipidemia associated with type 2 diabetes mellitus ([place]) 04/29/2021   Hypertension    Obesity    Subclinical hypothyroidism 04/29/2021    SURGICAL HISTORY: Past Surgical History:  Procedure Laterality Date   gallstone removal     KNEE SURGERY      SOCIAL HISTORY: Social History   Socioeconomic History   Marital status: Single    Spouse name: Not on file   Number of children: Not on file   Years of education: Not on file   Highest education level: Not on file  Occupational History   Not on file  Tobacco Use   Smoking status: Never   Smokeless tobacco: Never  Substance and Sexual Activity   Alcohol use: No   Drug use: No   Sexual activity: Not on file  Other Topics Concern   Not on file  Social History Narrative   Not on file   Social Determinants of Health   Financial Resource Strain: Not on file  Food Insecurity: Not on file  Transportation Needs: Not on file  Physical Activity: Not on file  Stress: Not on file  Social Connections: Not on file  Intimate Partner Violence: Not on file    FAMILY HISTORY: Family History  Problem Relation Age of Onset   Heart failure Mother    Breast cancer Neg Hx     ALLERGIES:  has No Known Allergies.  MEDICATIONS:  Current Outpatient Medications  Medication Sig Dispense Refill   atorvastatin (LIPITOR) 10 MG tablet Take 1 tablet (10 mg total) by mouth daily. 30 tablet 2   metFORMIN (GLUCOPHAGE) 500 MG tablet Take 1 tablet (500 mg total) by mouth daily with  breakfast. 90 tablet 1   traMADol (ULTRAM) 50 MG tablet Take by mouth every 6 (six) hours as needed.     ibuprofen (ADVIL,MOTRIN) 800 MG tablet Take 1 tablet (800 mg total) by mouth every 8 (eight) hours as needed for mild pain. 30 tablet 0   No current facility-administered medications for this visit.    REVIEW OF SYSTEMS:    10 Point review of Systems was done is negative except as noted above.  PHYSICAL EXAMINATION: ECOG PERFORMANCE STATUS: 1 - Symptomatic but completely ambulatory  . Vitals:   05/11/21 1149  BP: (!) 191/97  Pulse: 87  Resp: 18  Temp: 98.4 F (36.9 C)  SpO2: 99%   Filed Weights   05/11/21 1149  Weight: (!) 314 lb 11.2 oz (142.7 kg)   .Body mass index is 57.56 kg/m.  GENERAL:alert, in no acute distress and comfortable SKIN: no acute rashes, no significant lesions EYES: conjunctiva are pink and non-injected, sclera anicteric OROPHARYNX: MMM, no exudates, no oropharyngeal erythema or ulceration NECK: supple, no JVD LYMPH:  no palpable lymphadenopathy in the cervical, axillary or inguinal regions LUNGS: clear to auscultation b/l with normal respiratory effort HEART: regular rate & rhythm ABDOMEN:  normoactive bowel sounds , non tender, not distended. Extremity: no pedal edema PSYCH: alert & oriented x 3 with fluent  speech NEURO: no focal motor/sensory deficits  LABORATORY DATA:  I have reviewed the data as listed  . CBC Latest Ref Rng & Units 04/28/2021  WBC 3.4 - 10.8 x10E3/uL 9.3  Hemoglobin 11.1 - 15.9 g/dL 14.7  Hematocrit 34.0 - 46.6 % 41.4  Platelets 150 - 450 x10E3/uL 402    . CMP Latest Ref Rng & Units 04/28/2021  Glucose 65 - 99 mg/dL 113(H)  BUN 8 - 27 mg/dL 13  Creatinine 0.57 - 1.00 mg/dL 0.89  Sodium 134 - 144 mmol/L 138  Potassium 3.5 - 5.2 mmol/L 4.4  Chloride 96 - 106 mmol/L 100  CO2 20 - 29 mmol/L 20  Calcium 8.7 - 10.3 mg/dL 9.9  Total Protein 6.0 - 8.5 g/dL 7.8  Total Bilirubin 0.0 - 1.2 mg/dL 0.5  Alkaline Phos 44 -  121 IU/L 117  AST 0 - 40 IU/L 12  ALT 0 - 32 IU/L 9    RADIOGRAPHIC STUDIES: I have personally reviewed the radiological images as listed and agreed with the findings in the report. DG Chest 2 View  Result Date: 05/04/2021 CLINICAL DATA:  65 year old female with a history of dyspnea on exertion EXAM: CHEST - 2 VIEW COMPARISON:  None. FINDINGS: Cardiomediastinal silhouette within normal limits in size and contour. No evidence of central vascular congestion. No interlobular septal thickening. No pneumothorax or pleural effusion. Coarsened interstitial markings, with no confluent airspace disease. No acute displaced fracture. Degenerative changes of the spine. Lateral view demonstrates multiple lytic lesions of the thoracic spine. IMPRESSION: Negative for acute cardiopulmonary disease. Multiple lytic lesions of the thoracic spine are implicated, which can be seen with multiple myeloma/metastatic disease. Further evaluation with thoracic spine CT may be useful, as well as consideration of referral to hematology/oncology. Electronically Signed   By: Corrie Mckusick D.O.   On: 05/04/2021 14:16   DG Shoulder Right  Result Date: 05/06/2021 CLINICAL DATA:  Severe right shoulder pain for 2 weeks EXAM: RIGHT SHOULDER - 2+ VIEW COMPARISON:  None. FINDINGS: There is no acute fracture or dislocation. Glenohumeral and acromioclavicular alignment is maintained. There is mild glenohumeral and acromioclavicular joint space narrowing with associated subchondral sclerosis and osteophytosis about the acromioclavicular joint. The soft tissues are unremarkable. IMPRESSION: 1. No acute fracture or dislocation. 2. Degenerative changes about the glenohumeral and acromioclavicular joints as above, worse at the [facility] joint. Electronically Signed   By: Valetta Mole M.D.   On: 05/06/2021 13:42    ASSESSMENT & PLAN:     All of the patients questions were answered with apparent satisfaction. The patient knows to call the clinic with  any problems, questions or concerns.      Sullivan Lone MD [place] AAHIVMS [facility] Hematology/Oncology Physician [facility]  (Office):       [phone] (Work cell):  [phone] (Fax):           [phone]  05/11/2021 12:10 PM

## 2021-05-12 LAB — KAPPA/LAMBDA LIGHT CHAINS
Kappa free light chain: 49 mg/L — ABNORMAL HIGH (ref 3.3–19.4)
Kappa, lambda light chain ratio: 2.03 — ABNORMAL HIGH (ref 0.26–1.65)
Lambda free light chains: 24.1 mg/L (ref 5.7–26.3)

## 2021-05-12 LAB — CANCER ANTIGEN 15-3: CA 15-3: 20.1 U/mL (ref 0.0–25.0)

## 2021-05-12 LAB — CANCER ANTIGEN 27.29: CA 27.29: 25.8 U/mL (ref 0.0–38.6)

## 2021-05-14 ENCOUNTER — Other Ambulatory Visit: Payer: Self-pay | Admitting: Family Medicine

## 2021-05-14 DIAGNOSIS — R928 Other abnormal and inconclusive findings on diagnostic imaging of breast: Secondary | ICD-10-CM

## 2021-05-15 ENCOUNTER — Telehealth: Payer: Self-pay | Admitting: Family Medicine

## 2021-05-15 DIAGNOSIS — Z1211 Encounter for screening for malignant neoplasm of colon: Secondary | ICD-10-CM

## 2021-05-15 DIAGNOSIS — C7951 Secondary malignant neoplasm of bone: Secondary | ICD-10-CM

## 2021-05-15 LAB — MULTIPLE MYELOMA PANEL, SERUM
Albumin SerPl Elph-Mcnc: 3.3 g/dL (ref 2.9–4.4)
Albumin/Glob SerPl: 0.9 (ref 0.7–1.7)
Alpha 1: 0.3 g/dL (ref 0.0–0.4)
Alpha2 Glob SerPl Elph-Mcnc: 0.9 g/dL (ref 0.4–1.0)
B-Globulin SerPl Elph-Mcnc: 1.1 g/dL (ref 0.7–1.3)
Gamma Glob SerPl Elph-Mcnc: 1.8 g/dL (ref 0.4–1.8)
Globulin, Total: 4.1 g/dL — ABNORMAL HIGH (ref 2.2–3.9)
IgA: 313 mg/dL (ref 87–352)
IgG (Immunoglobin G), Serum: 1906 mg/dL — ABNORMAL HIGH (ref 586–1602)
IgM (Immunoglobulin M), Srm: 111 mg/dL (ref 26–217)
Total Protein ELP: 7.4 g/dL (ref 6.0–8.5)

## 2021-05-15 NOTE — Telephone Encounter (Signed)
Referral for Haley Stanley to [facility]

## 2021-05-15 NOTE — Telephone Encounter (Signed)
Pt called and said Dr. Ulyses Amor office called and told her she needed to give them a credit card number to keep on file. She has 2 insurances but the lady told her its their office policy. She was not comfortable doing that so she wanted to see if there was another place that she could possibly be referred to

## 2021-05-15 NOTE — Telephone Encounter (Signed)
Pt said that is fine she would rather do [facility]. I will let Cyril Mourning know

## 2021-05-15 NOTE — Telephone Encounter (Signed)
Order to [facility] placed per Vickie with dx of Screening for bone metastasis and screening colonoscopy

## 2021-05-20 ENCOUNTER — Encounter: Payer: Self-pay | Admitting: Gastroenterology

## 2021-05-21 ENCOUNTER — Ambulatory Visit (HOSPITAL_COMMUNITY)
Admission: RE | Admit: 2021-05-21 | Discharge: 2021-05-21 | Disposition: A | Discharge status: Home / Self Care | Payer: Medicare Other | Source: Ambulatory Visit | Attending: Hematology | Admitting: Hematology

## 2021-05-21 DIAGNOSIS — M47814 Spondylosis without myelopathy or radiculopathy, thoracic region: Secondary | ICD-10-CM | POA: Diagnosis not present

## 2021-05-21 DIAGNOSIS — R918 Other nonspecific abnormal finding of lung field: Secondary | ICD-10-CM | POA: Diagnosis not present

## 2021-05-21 DIAGNOSIS — C7951 Secondary malignant neoplasm of bone: Secondary | ICD-10-CM | POA: Diagnosis not present

## 2021-05-21 MED ORDER — GADOBUTROL 1 MMOL/ML IV SOLN
10.0000 mL | Freq: Once | INTRAVENOUS | Status: AC | PRN
  Administered 2021-05-21: 10 mL via INTRAVENOUS

## 2021-05-22 ENCOUNTER — Encounter: Payer: Self-pay | Admitting: Family Medicine

## 2021-05-22 ENCOUNTER — Ambulatory Visit (INDEPENDENT_AMBULATORY_CARE_PROVIDER_SITE_OTHER): Payer: Medicare Other | Admitting: Family Medicine

## 2021-05-22 ENCOUNTER — Other Ambulatory Visit (HOSPITAL_COMMUNITY)
Admission: RE | Admit: 2021-05-22 | Discharge: 2021-05-22 | Disposition: A | Discharge status: Home / Self Care | Payer: Medicare Other | Source: Ambulatory Visit | Attending: Family Medicine | Admitting: Family Medicine

## 2021-05-22 ENCOUNTER — Other Ambulatory Visit: Payer: Self-pay

## 2021-05-22 VITALS — BP 140/96 | HR 94 | Ht 61.5 in | Wt 313.0 lb

## 2021-05-22 DIAGNOSIS — Z1151 Encounter for screening for human papillomavirus (HPV): Secondary | ICD-10-CM | POA: Diagnosis not present

## 2021-05-22 DIAGNOSIS — R87610 Atypical squamous cells of undetermined significance on cytologic smear of cervix (ASC-US): Secondary | ICD-10-CM | POA: Insufficient documentation

## 2021-05-22 DIAGNOSIS — C7951 Secondary malignant neoplasm of bone: Secondary | ICD-10-CM | POA: Insufficient documentation

## 2021-05-22 DIAGNOSIS — Z01419 Encounter for gynecological examination (general) (routine) without abnormal findings: Secondary | ICD-10-CM | POA: Diagnosis not present

## 2021-05-22 DIAGNOSIS — Z124 Encounter for screening for malignant neoplasm of cervix: Secondary | ICD-10-CM | POA: Insufficient documentation

## 2021-05-22 NOTE — Progress Notes (Signed)
   Subjective:    Patient ID: Haley Stanley, female    DOB: [birth year], 65 y.o.   MRN: [id number]  Gynecologic Exam  Chief Complaint  Patient presents with   Gynecologic Exam   Here for cervical cancer screening. No pap in 20+ years.  Recent diagnosis of metastatic bone disease. Managed by oncologist.   Denies any symptoms.   Reports taking medication for diabetes and cholesterol and no concerns.    Review of Systems Pertinent positives and negatives in the history of present illness.     Objective:   Physical Exam Exam conducted with a chaperone present.  Constitutional:      Appearance: She is obese. She is not ill-appearing.  Pulmonary:     Effort: Pulmonary effort is normal.  Genitourinary:    Exam position: Lithotomy position.     Labia:        Right: No rash, tenderness or lesion.        Left: No rash, tenderness or lesion.      Vagina: Normal.     Adnexa:        Right: No tenderness or fullness.         Left: No tenderness or fullness.       Comments: No palpable masses but exam limited due to body habitus.  Unable to fully visualize cervical os Neurological:     Mental Status: She is alert.   BP (!) 140/96   Pulse 94   Ht 5' 1.5" (1.562 m)   Wt (!) 313 lb (142 kg)   SpO2 99%   BMI 58.18 kg/m       Assessment & Plan:  Screening for cervical cancer - Plan: Cytology - PAP([place])  Bone metastases ([place]) - Plan: Cytology - PAP([place])  Pap smear done.  It is difficult to visualize her cervical os due to the body habitus.  Discussed that we will await results and if needed, refer her to gynecology for further evaluation.

## 2021-05-25 ENCOUNTER — Other Ambulatory Visit: Payer: Self-pay

## 2021-05-25 ENCOUNTER — Inpatient Hospital Stay: Payer: Medicare Other | Attending: Hematology | Admitting: Hematology

## 2021-05-25 VITALS — BP 166/99 | HR 74 | Temp 98.2°F | Resp 17 | Wt 314.4 lb

## 2021-05-25 DIAGNOSIS — E119 Type 2 diabetes mellitus without complications: Secondary | ICD-10-CM | POA: Diagnosis not present

## 2021-05-25 DIAGNOSIS — C7951 Secondary malignant neoplasm of bone: Secondary | ICD-10-CM

## 2021-05-25 DIAGNOSIS — E559 Vitamin D deficiency, unspecified: Secondary | ICD-10-CM | POA: Diagnosis not present

## 2021-05-25 DIAGNOSIS — M899 Disorder of bone, unspecified: Secondary | ICD-10-CM | POA: Diagnosis not present

## 2021-05-25 DIAGNOSIS — I10 Essential (primary) hypertension: Secondary | ICD-10-CM | POA: Diagnosis not present

## 2021-05-25 MED ORDER — ERGOCALCIFEROL 1.25 MG (50000 UT) PO CAPS: 50000.0000 [IU] | ORAL_CAPSULE | ORAL | 3 refills | Status: DC

## 2021-05-26 ENCOUNTER — Telehealth: Payer: Self-pay | Admitting: Family Medicine

## 2021-05-26 ENCOUNTER — Other Ambulatory Visit: Payer: Self-pay | Admitting: Family Medicine

## 2021-05-26 DIAGNOSIS — E119 Type 2 diabetes mellitus without complications: Secondary | ICD-10-CM

## 2021-05-26 DIAGNOSIS — C7951 Secondary malignant neoplasm of bone: Secondary | ICD-10-CM

## 2021-05-26 DIAGNOSIS — E2839 Other primary ovarian failure: Secondary | ICD-10-CM

## 2021-05-26 MED ORDER — METFORMIN HCL ER 500 MG PO TB24: 500.0000 mg | ORAL_TABLET | Freq: Every day | ORAL | 0 refills | Status: DC

## 2021-05-26 NOTE — Telephone Encounter (Signed)
She has visit with oncology yesterday and they recommend she get bone density because her vitamin D is very low   She also feels like metformin is causing her to feel light headed and diarrhea

## 2021-05-27 ENCOUNTER — Other Ambulatory Visit: Payer: Self-pay | Admitting: Family Medicine

## 2021-05-27 ENCOUNTER — Encounter: Payer: Self-pay | Admitting: Family Medicine

## 2021-05-27 DIAGNOSIS — A599 Trichomoniasis, unspecified: Secondary | ICD-10-CM

## 2021-05-27 DIAGNOSIS — R234 Changes in skin texture: Secondary | ICD-10-CM

## 2021-05-27 DIAGNOSIS — R87610 Atypical squamous cells of undetermined significance on cytologic smear of cervix (ASC-US): Secondary | ICD-10-CM

## 2021-05-27 HISTORY — DX: Trichomoniasis, unspecified: A59.9

## 2021-05-27 HISTORY — DX: Changes in skin texture: R23.4

## 2021-05-27 HISTORY — DX: Atypical squamous cells of undetermined significance on cytologic smear of cervix (ASC-US): R87.610

## 2021-05-27 LAB — CYTOLOGY - PAP
Comment: NEGATIVE
Diagnosis: UNDETERMINED — AB
High risk HPV: NEGATIVE

## 2021-05-27 MED ORDER — ACCU-CHEK SOFTCLIX LANCETS MISC
5 refills | Status: DC
Start: 2021-05-27 — End: 2021-08-05

## 2021-05-27 MED ORDER — ACCU-CHEK SOFTCLIX LANCETS MISC: 5 refills | Status: DC

## 2021-05-27 MED ORDER — ACCU-CHEK GUIDE W/DEVICE KIT
PACK | 0 refills | Status: DC
Start: 2021-05-27 — End: 2021-08-05

## 2021-05-27 MED ORDER — ACCU-CHEK GUIDE W/DEVICE KIT: PACK | 0 refills | Status: DC

## 2021-05-27 MED ORDER — METRONIDAZOLE 500 MG PO TABS: 500.0000 mg | ORAL_TABLET | Freq: Two times a day (BID) | ORAL | 0 refills | Status: DC

## 2021-05-27 NOTE — Progress Notes (Signed)
Please see my note to Haley Stanley and call her regarding an OB/GYN referral. I will also send in an antibiotic to treat trichomonas that was seen on her pap smear.

## 2021-05-27 NOTE — Progress Notes (Signed)
   Subjective:    Patient ID: Haley Stanley, female    DOB: 19-Dec-1955, 65 y.o.   MRN: [id number]  HPI    Review of Systems     Objective:   Physical Exam        Assessment & Plan:

## 2021-05-27 NOTE — Telephone Encounter (Signed)
Spoke to patient and states she does need our office to order bone density screening  States she would like to try something besides Metformin because she has had uncontrollable diarrhea the past 2 weeks she has started taking and sometimes is dizzy  States she does not have a way to check blood sugars at home, offered to send in Glucometer and states that would be great because she is going to pharmacy today anyways    Glucometer prescription sent in

## 2021-05-28 ENCOUNTER — Encounter (HOSPITAL_COMMUNITY): Payer: Self-pay

## 2021-05-28 ENCOUNTER — Other Ambulatory Visit: Payer: Self-pay

## 2021-05-28 ENCOUNTER — Ambulatory Visit (HOSPITAL_COMMUNITY): Admission: RE | Admit: 2021-05-28 | Payer: Medicare Other | Source: Ambulatory Visit

## 2021-05-28 DIAGNOSIS — R87610 Atypical squamous cells of undetermined significance on cytologic smear of cervix (ASC-US): Secondary | ICD-10-CM

## 2021-05-28 NOTE — Progress Notes (Signed)
OBGYN referral for recent pap results

## 2021-05-29 ENCOUNTER — Other Ambulatory Visit: Payer: Self-pay | Admitting: Family Medicine

## 2021-05-29 ENCOUNTER — Ambulatory Visit
Admission: RE | Admit: 2021-05-29 | Discharge: 2021-05-29 | Disposition: A | Discharge status: Home / Self Care | Payer: Medicare Other | Source: Ambulatory Visit | Attending: Family Medicine | Admitting: Family Medicine

## 2021-05-29 ENCOUNTER — Other Ambulatory Visit: Payer: Self-pay

## 2021-05-29 DIAGNOSIS — N6489 Other specified disorders of breast: Secondary | ICD-10-CM

## 2021-05-29 DIAGNOSIS — R928 Other abnormal and inconclusive findings on diagnostic imaging of breast: Secondary | ICD-10-CM

## 2021-05-29 DIAGNOSIS — R922 Inconclusive mammogram: Secondary | ICD-10-CM | POA: Diagnosis not present

## 2021-05-29 NOTE — Telephone Encounter (Signed)
Pt notified with result notes on 05/28/21:   Billy Fischer, LPN  00/09/9845 30:85 AM EDT

## 2021-06-01 ENCOUNTER — Telehealth: Payer: Self-pay | Admitting: Family Medicine

## 2021-06-01 MED ORDER — ACCU-CHEK GUIDE VI STRP: ORAL_STRIP | 5 refills | Status: DC

## 2021-06-01 NOTE — Telephone Encounter (Signed)
Patient notified without any further questions or concerns  

## 2021-06-01 NOTE — Progress Notes (Addendum)
Marland Kitchen   HEMATOLOGY/ONCOLOGY CLINIC NOTE  Date of Service: 06/01/2021  Patient Care Team: Girtha Rm, NP-C as PCP - General (Family Medicine)  CHIEF COMPLAINTS/PURPOSE OF CONSULTATION:  Multiple lytic lesions of the thoracic spine on chest x-ray  HISTORY OF PRESENTING ILLNESS:   Haley Stanley is a wonderful 65 y.o. female who has been referred to [place] by Dr .Raenette Rover, Laurian Brim, NP-C  for evaluation and management of incidentally noted multiple lytic lesions of the thoracic spine noted on a chest x-ray that was done on 05/04/2021 by her primary care physician to work-up some dyspnea on exertion.  Patient notes she has a history of hypertension, diabetes, dyslipidemia, obesity, asthma and has been having some dyspnea on exertion for the last few months.  No overt chest pain or shortness of breath. Has not had any sudden weight loss. No fevers no chills no night sweats. Denies any obvious new focal bone pains in her spine or other bones. Denies any recent injury to her back. No other obvious new focal symptoms.  Recent mammogram done on 05/08/2021 the results of these were not available at the time of the clinic visit. Patient notes she has never had a colonoscopy. Available labs on 04/28/2021 showed normal CBC and CMP Hemoglobin A1c 6.9. TSH 4.54 with a normal free T4 of 1.04  BNP was within normal limits at 10.3  INTERVAL HISTORY  Patient is here to discuss the results of her labs and imaging studies. Labs done on 05/11/2021 showed normal CBC, unremarkable CMP, normal LDH, multiple myeloma panel with no M spike, kappa lambda light chains not definitively abnormal. Breast cancer tumor markers CA 15 3 and CA 27-29 within normal limits 25 hydroxy vitamin D 15.7 MRI of the thoracic spine with and without contrast on 05/21/2021 showed 1. No focal or acute marrow signal changes to suggest osseous lesions. No evidence for metastatic disease or myeloma. Findings on chest x-ray were  artifactual. 2. Chronic compression deformities at T8 and T9. 3. Facet hypertrophy contributes to mild right foraminal narrowing at T3-4, T4-5, and T8-9. 4. Mild left foraminal narrowing at T10-11.   Patient notes no acute new symptoms.  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   SURGICAL HISTORY: Past Surgical History:  Procedure Laterality Date   gallstone removal     KNEE SURGERY    LN removal at age 67 yrs   SOCIAL HISTORY: Social History   Socioeconomic History   Marital status: Single    Spouse name: Not on file   Number of children: Not on file   Years of education: Not on file   Highest education level: Not on file  Occupational History   Not on file  Tobacco Use   Smoking status: Never   Smokeless tobacco: Never  Substance and Sexual Activity   Alcohol use: No   Drug use: No   Sexual activity: Not on file  Other Topics Concern   Not on file  Social History Narrative   Not  on file   Social Determinants of Health   Financial Resource Strain: Not on file  Food Insecurity: Not on file  Transportation Needs: Not on file  Physical Activity: Not on file  Stress: Not on file  Social Connections: Not on file  Intimate Partner Violence: Not on file  Never smoker No ETOH No other recreational drugs  FAMILY HISTORY: Family History  Problem Relation Age of Onset   Heart failure Mother    Breast cancer Neg Hx   MGM - breast cancer Mother - CHF Daughter - HTN,  anemia  ALLERGIES:  has No Known Allergies.  MEDICATIONS:  Current Outpatient Medications  Medication Sig Dispense Refill   ergocalciferol (VITAMIN D2) 1.25 MG (50000 UT) capsule Take 1 capsule (50,000 Units total) by mouth 2 (two) times a week. 12 capsule 3   Accu-Chek Softclix Lancets lancets Use as instructed 100 each 5   atorvastatin (LIPITOR) 10 MG tablet Take 1 tablet (10 mg total) by mouth daily. 30 tablet 2   Blood Glucose Monitoring Suppl (ACCU-CHEK GUIDE) w/Device KIT Check Blood Sugars once a day Include corresponding test strips # 50 Diagnosis: Type 2 Diabetes, new onset 1 kit 0   metFORMIN (GLUCOPHAGE XR) 500 MG 24 hr tablet Take 1 tablet (500 mg total) by mouth daily with breakfast. 90 tablet 0   metroNIDAZOLE (FLAGYL) 500 MG tablet Take 1 tablet (500 mg total) by mouth 2 (two) times daily. 14 tablet 0   traMADol (ULTRAM) 50 MG tablet Take by mouth every 6 (six) hours as needed.     No current facility-administered medications for this visit.    REVIEW OF SYSTEMS:    .10 Point review of Systems was done is negative except as noted above.   PHYSICAL EXAMINATION: ECOG PERFORMANCE STATUS: 1 - Symptomatic but completely ambulatory  . Vitals:   05/25/21 1057  BP: (!) 166/99  Pulse: 74  Resp: 17  Temp: 98.2 F (36.8 C)  SpO2: 99%   Filed Weights   05/25/21 1057  Weight: (!) 314 lb 7 oz (142.6 kg)   .Body mass index is 58.45 kg/m.  Marland Kitchen GENERAL:alert, in no acute distress and comfortable SKIN: no acute rashes, no significant lesions EYES: conjunctiva are pink and non-injected, sclera anicteric OROPHARYNX: MMM, no exudates, no oropharyngeal erythema or ulceration NECK: supple, no JVD LYMPH:  no palpable lymphadenopathy in the cervical, axillary or inguinal regions LUNGS: clear to auscultation b/l with normal respiratory effort HEART: regular rate & rhythm ABDOMEN:  normoactive bowel sounds , non tender, not distended. Extremity: no pedal edema PSYCH: alert &  oriented x 3 with fluent speech NEURO: no focal motor/sensory deficits   LABORATORY DATA:  I have reviewed the data as listed  . CBC Latest Ref Rng & Units 05/11/2021 04/28/2021  WBC 4.0 - 10.5 K/uL 8.6 9.3  Hemoglobin 12.0 - 15.0 g/dL 13.5 14.7  Hematocrit 36.0 - 46.0 % 40.3 41.4  Platelets 150 - 400 K/uL 361 402    . CMP Latest Ref Rng & Units 05/11/2021 04/28/2021  Glucose 70 - 99 mg/dL 104(H) 113(H)  BUN 8 - 23 mg/dL 10 13  Creatinine 0.44 - 1.00 mg/dL 0.86 0.89  Sodium 135 - 145 mmol/L 140 138  Potassium 3.5 - 5.1 mmol/L 3.8 4.4  Chloride 98 - 111 mmol/L 106 100  CO2 22 - 32 mmol/L 23 20  Calcium 8.9 - 10.3 mg/dL 9.8 9.9  Total Protein 6.5 - 8.1 g/dL 8.4(H) 7.8  Total Bilirubin 0.3 - 1.2  mg/dL 0.7 0.5  Alkaline Phos 38 - 126 U/L 97 117  AST 15 - 41 U/L 12(L) 12  ALT 0 - 44 U/L 9 9   Component     Latest Ref Rng & Units 05/11/2021  IgG (Immunoglobin G), Serum     586 - 1,602 mg/dL 1,906 (H)  IgA     87 - 352 mg/dL 313  IgM (Immunoglobulin M), Srm     26 - 217 mg/dL 111  Total Protein ELP     6.0 - 8.5 g/dL 7.4  Albumin SerPl Elph-Mcnc     2.9 - 4.4 g/dL 3.3  Alpha 1     0.0 - 0.4 g/dL 0.3  Alpha2 Glob SerPl Elph-Mcnc     0.4 - 1.0 g/dL 0.9  B-Globulin SerPl Elph-Mcnc     0.7 - 1.3 g/dL 1.1  Gamma Glob SerPl Elph-Mcnc     0.4 - 1.8 g/dL 1.8  M Protein SerPl Elph-Mcnc     Not Observed g/dL Not Observed  Globulin, Total     2.2 - 3.9 g/dL 4.1 (H)  Albumin/Glob SerPl     0.7 - 1.7 0.9  IFE 1      Comment (A)  Please Note (HCV):      Comment  Kappa free light chain     3.3 - 19.4 mg/L 49.0 (H)  Lambda free light chains     5.7 - 26.3 mg/L 24.1  Kappa, lambda light chain ratio     0.26 - 1.65 2.03 (H)  Vitamin D, 25-Hydroxy     30 - 100 ng/mL 15.74 (L)  CA 27.29     0.0 - 38.6 U/mL 25.8  CA 15-3     0.0 - 25.0 U/mL 20.1  LDH     98 - 192 U/L 177    RADIOGRAPHIC STUDIES: I have personally reviewed the radiological images as listed and agreed with  the findings in the report. DG Chest 2 View  Result Date: 05/04/2021 CLINICAL DATA:  65 year old female with a history of dyspnea on exertion EXAM: CHEST - 2 VIEW COMPARISON:  None. FINDINGS: Cardiomediastinal silhouette within normal limits in size and contour. No evidence of central vascular congestion. No interlobular septal thickening. No pneumothorax or pleural effusion. Coarsened interstitial markings, with no confluent airspace disease. No acute displaced fracture. Degenerative changes of the spine. Lateral view demonstrates multiple lytic lesions of the thoracic spine. IMPRESSION: Negative for acute cardiopulmonary disease. Multiple lytic lesions of the thoracic spine are implicated, which can be seen with multiple myeloma/metastatic disease. Further evaluation with thoracic spine CT may be useful, as well as consideration of referral to hematology/oncology. Electronically Signed   By: Corrie Mckusick D.O.   On: 05/04/2021 14:16   DG Shoulder Right  Result Date: 05/06/2021 CLINICAL DATA:  Severe right shoulder pain for 2 weeks EXAM: RIGHT SHOULDER - 2+ VIEW COMPARISON:  None. FINDINGS: There is no acute fracture or dislocation. Glenohumeral and acromioclavicular alignment is maintained. There is mild glenohumeral and acromioclavicular joint space narrowing with associated subchondral sclerosis and osteophytosis about the acromioclavicular joint. The soft tissues are unremarkable. IMPRESSION: 1. No acute fracture or dislocation. 2. Degenerative changes about the glenohumeral and acromioclavicular joints as above, worse at the [facility] joint. Electronically Signed   By: Valetta Mole M.D.   On: 05/06/2021 13:42   MR THORACIC SPINE W WO CONTRAST  Result Date: 05/24/2021 CLINICAL DATA:  Abnormal chest x-ray. Possible bone lesions. EXAM: MRI THORACIC WITHOUT AND WITH CONTRAST TECHNIQUE:  Multiplanar and multiecho pulse sequences of the thoracic spine were obtained without and with intravenous contrast. CONTRAST:   64m GADAVIST GADOBUTROL 1 MMOL/ML IV SOLN COMPARISON:  Two-view chest x-ray 05/04/2021 FINDINGS: Alignment:  No significant listhesis is present. Vertebrae: Chronic loss of height present at T9. Mild anterior loss of height present at T8. No acute fractures. No focal osseous lesions or abnormal enhancement is present. No evidence for metastatic disease or myeloma. Cord:  Normal signal and morphology. Paraspinal and other soft tissues: Limited imaging the abdomen is unremarkable. There is no significant adenopathy. No solid organ lesions are present. Visualized lung fields and mediastinum are within normal limits. Paraspinous musculature is unremarkable. Disc levels: T1-2: Negative. T2-3: Negative. T3-4: Facet hypertrophy contributes to mild right foraminal narrowing. T4-5: Facet hypertrophy contributes to mild right foraminal narrowing. The central canal is patent. T5-6: Negative. T6-7: Negative. T7-8: Negative. T8-9: Facet hypertrophy results in mild foraminal narrowing bilaterally. This is worse on the right. T9-10: Negative. T10-11: Mild left foraminal narrowing is due to facet hypertrophy. T11-12: Negative. T12-L1: Negative. IMPRESSION: 1. No focal or acute marrow signal changes to suggest osseous lesions. No evidence for metastatic disease or myeloma. Findings on chest x-ray were artifactual. 2. Chronic compression deformities at T8 and T9. 3. Facet hypertrophy contributes to mild right foraminal narrowing at T3-4, T4-5, and T8-9. 4. Mild left foraminal narrowing at T10-11. Electronically Signed   By: CSan MorelleM.D.   On: 05/24/2021 09:00   [place]TD UNI RIGHT INC AXILLA  Result Date: 05/29/2021 CLINICAL DATA:  65year old female for further evaluation of possible RIGHT breast asymmetry on new baseline screening mammogram. EXAM: DIGITAL DIAGNOSTIC UNILATERAL RIGHT MAMMOGRAM WITH TOMOSYNTHESIS AND CAD; ULTRASOUND RIGHT BREAST LIMITED TECHNIQUE: Right digital diagnostic mammography and breast  tomosynthesis was performed. The images were evaluated with computer-aided detection.; Targeted ultrasound examination of the right breast was performed COMPARISON:  Previous exam(s). ACR Breast Density Category b: There are scattered areas of fibroglandular density. FINDINGS: 2D/3D spot compression views of the RIGHT breast demonstrate a less conspicuous 0.4 cm asymmetry within the UPPER RIGHT breast, only well identified on the MLO view. Targeted ultrasound is performed, showing no sonographic abnormalities within the UPPER or OUTER RIGHT breast. IMPRESSION: 1. Less conspicuous UPPER RIGHT breast asymmetry without sonographic correlate, more likely benign or normal fibroglandular tissue. Six-month follow-up is recommended to ensure stability. RECOMMENDATION: RIGHT diagnostic mammogram with possible RIGHT breast ultrasound in 6 months. I have discussed the findings and recommendations with the patient. If applicable, a reminder letter will be sent to the patient regarding the next appointment. BI-RADS CATEGORY  3: Probably benign. Electronically Signed   By: JMargarette CanadaM.D.   On: 05/29/2021 11:35  MM DIAG BREAST TOMO UNI RIGHT  Result Date: 05/29/2021 CLINICAL DATA:  65year old female for further evaluation of possible RIGHT breast asymmetry on new baseline screening mammogram. EXAM: DIGITAL DIAGNOSTIC UNILATERAL RIGHT MAMMOGRAM WITH TOMOSYNTHESIS AND CAD; ULTRASOUND RIGHT BREAST LIMITED TECHNIQUE: Right digital diagnostic mammography and breast tomosynthesis was performed. The images were evaluated with computer-aided detection.; Targeted ultrasound examination of the right breast was performed COMPARISON:  Previous exam(s). ACR Breast Density Category b: There are scattered areas of fibroglandular density. FINDINGS: 2D/3D spot compression views of the RIGHT breast demonstrate a less conspicuous 0.4 cm asymmetry within the UPPER RIGHT breast, only well identified on the MLO view. Targeted ultrasound is  performed, showing no sonographic abnormalities within the UPPER or OUTER RIGHT breast. IMPRESSION: 1. Less conspicuous UPPER RIGHT breast  asymmetry without sonographic correlate, more likely benign or normal fibroglandular tissue. Six-month follow-up is recommended to ensure stability. RECOMMENDATION: RIGHT diagnostic mammogram with possible RIGHT breast ultrasound in 6 months. I have discussed the findings and recommendations with the patient. If applicable, a reminder letter will be sent to the patient regarding the next appointment. BI-RADS CATEGORY  3: Probably benign. Electronically Signed   By: Margarette Canada M.D.   On: 05/29/2021 11:35  MM 3D SCREEN BREAST BILATERAL  Result Date: 05/13/2021 CLINICAL DATA:  Screening. EXAM: DIGITAL SCREENING BILATERAL MAMMOGRAM WITH TOMOSYNTHESIS AND CAD TECHNIQUE: Bilateral screening digital craniocaudal and mediolateral oblique mammograms were obtained. Bilateral screening digital breast tomosynthesis was performed. The images were evaluated with computer-aided detection. COMPARISON:  None. ACR Breast Density Category b: There are scattered areas of fibroglandular density. FINDINGS: In the right breast, a possible asymmetry warrants further evaluation. In the left breast, no findings suspicious for malignancy. IMPRESSION: Further evaluation is suggested for possible asymmetry in the right breast. RECOMMENDATION: Diagnostic mammogram and possibly ultrasound of the right breast. (Code:[id number]) The patient will be contacted regarding the findings, and additional imaging will be scheduled. BI-RADS CATEGORY  0: Incomplete. Need additional imaging evaluation and/or prior mammograms for comparison. Electronically Signed   By: Fidela Salisbury M.D.   On: 05/13/2021 14:55    ASSESSMENT & PLAN:   65 year old female with history of hypertension, diabetes, dyslipidemia with shortness of breath on exertion for the last few months with  #1 Multiple lytic lesions noted on  thoracic spine on chest x-ray. Patient does not have overt symptoms from this.  These were incidentally noted on imaging. Unable to differentiate whether she is having true dyspnea on exertion versus fatigue. Plan - Labs done on 05/11/2021 showed normal CBC, unremarkable CMP, normal LDH, multiple myeloma panel with no M spike, kappa lambda light chains not definitively abnormal. Breast cancer tumor markers CA 15 3 and CA 27-29 within normal limits 25 hydroxy vitamin D 15.7 MRI of the thoracic spine with and without contrast on 05/21/2021 showed 1. No focal or acute marrow signal changes to suggest osseous lesions. No evidence for metastatic disease or myeloma. Findings on chest x-ray were artifactual. 2. Chronic compression deformities at T8 and T9. 3. Facet hypertrophy contributes to mild right foraminal narrowing at T3-4, T4-5, and T8-9. 4. Mild left foraminal narrowing at T10-11.  -Discussed with the patient that since MRI of the thoracic spine shows that the bone lesions are artifactual and not bone metastases-there is no additional indication for PET CT scan or additional work-up. -Will need aggressive replacement of her vitamin D deficiency.  Started on ergocalciferol 50,000 units twice weekly with recommendation to follow-up with primary care physician in 2 to 3 months to readjust the dose. -Continue age-appropriate cancer screening with primary care physician.  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   RTC with Dr Irene Limbo as needed Patient will call to set up followup if needed for abnormal mammogram if  diagnostic studies are concerning.    . The total time spent in the appointment was 25 minutes and more than 50% was on counseling and direct patient cares.   All of the patients questions were answered with apparent satisfaction. The patient knows to call the clinic with any problems, questions or concerns.     Sullivan Lone MD [place] AAHIVMS [facility] Hematology/Oncology Physician [facility]

## 2021-06-01 NOTE — Telephone Encounter (Signed)
Called patient and sent in rx  Patient wants to let Vickie know that tests come back she is cancer free:)  Patient asks which specialist would be most beneficial for her back pain : A chiropractor or an orthopedist?

## 2021-06-01 NOTE — Telephone Encounter (Signed)
Pt is requesting a new prescription for her test strips sent to the [facility] on [address]

## 2021-06-02 ENCOUNTER — Telehealth: Payer: Self-pay

## 2021-06-02 ENCOUNTER — Encounter: Payer: Self-pay | Admitting: Family Medicine

## 2021-06-02 MED ORDER — ONETOUCH VERIO W/DEVICE KIT: PACK | 0 refills | Status: DC

## 2021-06-02 MED ORDER — GLUCOSE BLOOD VI STRP: ORAL_STRIP | 4 refills | Status: DC

## 2021-06-02 NOTE — Telephone Encounter (Signed)
Refill reuest 

## 2021-06-02 NOTE — Telephone Encounter (Signed)
See my chart message

## 2021-06-03 ENCOUNTER — Encounter: Payer: Self-pay | Admitting: Family Medicine

## 2021-06-03 ENCOUNTER — Telehealth: Payer: Self-pay

## 2021-06-03 MED ORDER — ONETOUCH DELICA LANCETS 33G MISC: 100.0000 | Freq: Every day | 2 refills | Status: DC

## 2021-06-03 NOTE — Telephone Encounter (Signed)
Lancet refills

## 2021-06-16 ENCOUNTER — Other Ambulatory Visit: Payer: Self-pay | Admitting: Family Medicine

## 2021-06-16 DIAGNOSIS — C7951 Secondary malignant neoplasm of bone: Secondary | ICD-10-CM

## 2021-06-16 DIAGNOSIS — E2839 Other primary ovarian failure: Secondary | ICD-10-CM

## 2021-06-17 ENCOUNTER — Other Ambulatory Visit: Payer: Self-pay | Admitting: Family Medicine

## 2021-06-17 DIAGNOSIS — E2839 Other primary ovarian failure: Secondary | ICD-10-CM

## 2021-06-17 DIAGNOSIS — C7951 Secondary malignant neoplasm of bone: Secondary | ICD-10-CM

## 2021-06-21 DIAGNOSIS — G4730 Sleep apnea, unspecified: Secondary | ICD-10-CM | POA: Diagnosis not present

## 2021-06-26 ENCOUNTER — Telehealth: Payer: Self-pay

## 2021-06-26 ENCOUNTER — Other Ambulatory Visit: Payer: Self-pay

## 2021-06-26 DIAGNOSIS — Z1211 Encounter for screening for malignant neoplasm of colon: Secondary | ICD-10-CM

## 2021-06-26 NOTE — Telephone Encounter (Signed)
Please review previous messages- if patient's procedure appt can be before her PV please keep appt - if not please reschedule PV appt and advise PV on changes Thank you

## 2021-06-26 NOTE — Telephone Encounter (Signed)
Dr. Tarri Glenn- In completing this patient's PV chart, it is noted that the patient's BMI is greater than 50. Please advise if the patient can have a direct at [facility] or does this patient need to be rescheduled for an OV? Thank you

## 2021-06-26 NOTE — Telephone Encounter (Signed)
Pt has been rescheduled with first available, Dr. Carlean Purl, for a direct colon @ [facility] on 08/31/21 @ 930am, arrive @ 8am. Prep instructions sent per pt request via My Chart. Amb referral placed for auth purposes. PV also rescheduled to 08/25/21 @ 230pm.

## 2021-07-06 ENCOUNTER — Other Ambulatory Visit: Payer: Self-pay

## 2021-07-06 DIAGNOSIS — E785 Hyperlipidemia, unspecified: Secondary | ICD-10-CM

## 2021-07-06 DIAGNOSIS — E1169 Type 2 diabetes mellitus with other specified complication: Secondary | ICD-10-CM

## 2021-07-06 MED ORDER — ATORVASTATIN CALCIUM 10 MG PO TABS: 10.0000 mg | ORAL_TABLET | Freq: Every day | ORAL | 0 refills | Status: DC

## 2021-07-23 ENCOUNTER — Encounter: Payer: Medicare Other | Admitting: Gastroenterology

## 2021-07-28 ENCOUNTER — Telehealth: Payer: Self-pay | Admitting: Internal Medicine

## 2021-07-28 NOTE — Telephone Encounter (Signed)
Matt with [employer] came in and dropped of sleep study for patient. Please look over since Vickie is no longer here. Sleep study results will be in your folder

## 2021-07-31 ENCOUNTER — Telehealth: Payer: Self-pay | Admitting: Medical

## 2021-07-31 NOTE — Telephone Encounter (Signed)
Pt was notified of results

## 2021-07-31 NOTE — Telephone Encounter (Signed)
I did not order this test on her.  This was ordered by Jocelyn Lamer before she left.  Her sleep study results came to me and showed no significant sleep apnea

## 2021-08-04 DIAGNOSIS — E559 Vitamin D deficiency, unspecified: Secondary | ICD-10-CM | POA: Insufficient documentation

## 2021-08-04 NOTE — Progress Notes (Signed)
Chief Complaint  Patient presents with   Diabetes    Med check, patient is nonfasting. No concerns. Has not had recent diabetic eye exam, she will schedule with [facility]. Patient has had 4 Pfizer vaccines, card is in car--she will get card/I will also look in [place].   Patient presents for med check.   She was recently diagnosed with diabetes, with A1c of 6.9% in September.  She was started on Metformin 580m once daily. She was started on the regular metformin, but was switched to the ER in October.  Diarrhea has improved since switching (currently only very slight diarrhea, not daily, tolerable) Fasting sugars are running (brought in monitor, read off from memory): Starting oldest to most recent: 140, 138, 136, 141, 140, 135, 150, 135, 129, 141, 135, 137, 117, 128, 141, 119, 136, 125, 123, 133, 133 Not checking other times of the day. She hasn't had any diabetes education.  She last had eye exam in February (before diagnosis of diabetes).  She was also noted to have high cholesterol, and was started on atorvastatin 169m  She is compliant and denies side effects. She reports she cut back on fried foods. She is due for recheck. She isn't fasting today. Lab Results  Component Value Date   CHOL 224 (H) 04/28/2021   HDL 65 04/28/2021   LDLCALC 117 (H) 04/28/2021   TRIG 241 (H) 04/28/2021   CHOLHDL 3.4 04/28/2021   Subclinical hypothyroidism was also noted in September labs. She denies changes in hair/skin/nails/bowels. Denies change in energy.  She has lost weight (intentional).  Lab Results  Component Value Date   TSH 4.540 (H) 04/28/2021  Free T4 and T3 were normal.  She had reported DOE for about 3 years.  Negative eval for any heart failure, CXR didn't show pulmonary etiology.   She went to 4 stores on Sunday, which caused her to feel out of breath, some back and knee pain.  She is not having symptoms on a daily basis.  Vitamin D deficiency:  level was low at 15.74.  Dr. KaIrene Limbostarted her on Rx replacement of 50,000 IU twice a week, and recommended PCP to recheck in 2-3 months.  Due for recheck.  ASCUS on pap 04/2021. No high risk HPV present.  Vickie referred her to GYN (but pt had to cancel appt, hasn't rescheduled it yet).. She was treated for trichomonas. She isn't sexually active and denies any vaginal discharge. She denies any vaginal bleeding    PMH, PSH, SH reviewed  Outpatient Encounter Medications as of 08/05/2021  Medication Sig Note   Accu-Chek Softclix Lancets lancets Use as instructed    acetaminophen (TYLENOL) 500 MG tablet Take 1,000 mg by mouth every 6 (six) hours as needed. 08/05/2021: Took some yesterday for some back pain   atorvastatin (LIPITOR) 10 MG tablet Take 1 tablet (10 mg total) by mouth daily.    Blood Glucose Monitoring Suppl (ACCU-CHEK GUIDE) w/Device KIT Check Blood Sugars once a day Include corresponding test strips # 50 Diagnosis: Type 2 Diabetes, new onset    Blood Glucose Monitoring Suppl (ONETOUCH VERIO) w/Device KIT Check FSBS daily    glucose blood test strip Use as instructed once a day    metFORMIN (GLUCOPHAGE XR) 500 MG 24 hr tablet Take 1 tablet (500 mg total) by mouth daily with breakfast.    OneTouch Delica Lancets [id number] by Does not apply route daily.    [DISCONTINUED] ergocalciferol (VITAMIN D2) 1.25 MG (50000  UT) capsule Take 1 capsule (50,000 Units total) by mouth 2 (two) times a week.    [DISCONTINUED] metroNIDAZOLE (FLAGYL) 500 MG tablet Take 1 tablet (500 mg total) by mouth 2 (two) times daily.    [DISCONTINUED] traMADol (ULTRAM) 50 MG tablet Take by mouth every 6 (six) hours as needed.    No facility-administered encounter medications on file as of 08/05/2021.   No Known Allergies  ROS: no fever, chills, URI symptoms, chest pain, shortness of breath. Fatigue/DOE is intermittent, improved. Back pain and knee pain is sporadic, only if she does too much. Shoulder pain--resolved No vaginal  bleeding, discharge or odor. No urinary complaints. No bleeding, bruising, rash. Moods are good.    PHYSICAL EXAM:  BP (!) 150/80    Pulse 88    Ht 5' 1.5" (1.562 m)    Wt (!) 306 lb 12.8 oz (139.2 kg)    BMI 57.03 kg/m    Wt Readings from Last 3 Encounters:  08/05/21 (!) 306 lb 12.8 oz (139.2 kg)  05/25/21 (!) 314 lb 7 oz (142.6 kg)  05/22/21 (!) 313 lb (142 kg)    Lab Results  Component Value Date   HGBA1C 6.3 (A) 08/05/2021   Pleasant, obese female in no distress HEENT: conjunctiva and sclera are clear, EOMI, wearing mask Neck: no lymphadenopathy, thyromegaly or mass Heart: regular rate and rhythm Lungs: clear bilaterally Back: no spinal or CVA tenderness Abdomen: obese, soft, nontender, no masses Extremities: no edema, 2+ pulses. No skin lesions, other than some dry skin. Normal sensation to monofilament. Neuro: alert and oriented, normal strength, gait Psych: normal mood, affect, hygiene and grooming  Lab Results  Component Value Date   HGBA1C 6.3 (A) 08/05/2021    ASSESSMENT/PLAN:  Diabetes mellitus, new onset ([place]) - improved with metformin. Refer to DM education. Reviewed how/when to check glu, keep log and bring to visits - Plan: HgB A1c, metFORMIN (GLUCOPHAGE XR) 500 MG 24 hr tablet, Microalbumin / creatinine urine ratio, Referral to Nutrition and Diabetes Services  Mixed hyperlipidemia - cont statin, due for recheck - Plan: Lipid panel, Referral to Nutrition and Diabetes Services  Vitamin D deficiency - on 2x/week rx replacement per oncologist. Due for recheck - Plan: VITAMIN D 25 Hydroxy (Vit-D Deficiency, Fractures)  Subclinical hypothyroidism - reviewed s/sx hypothyroidism; to f/u if sx develop  Need for COVID-19 vaccine - Plan: Ambulance person Booster  Need for hepatitis C screening test - Plan: Hepatitis C antibody  Screening for HIV (human immunodeficiency virus) - Plan: HIV Antibody (routine testing w rflx)  Medication  monitoring encounter - Plan: Lipid panel, VITAMIN D 25 Hydroxy (Vit-D Deficiency, Fractures), Comprehensive metabolic panel  Essential hypertension - BP's high at al visits. Pt with DM, so will start ARB. chem panel in 2 weeks (with other fasting labs), and OV 4 weeks, f/u BP - Plan: valsartan (DIOVAN) 80 MG tablet, Referral to Nutrition and Diabetes Services  Atypical squamous cell changes of undetermined significance (ASCUS) on cervical cytology with negative high risk human papilloma virus (HPV) test result - guidelines for ASCUS without high risk HPV is to repeat pap in 1 year. Doesn't need to schedule with GYN at this time  Reminded to get diabetic eye exam and request records to be sent to [place]. Reviewed goal sugars, how to keep glucose log, to check various times of day.  Elevated BP, consistently/HTN.  Start Valsartan 91m--chem in 2 wks, OV 4 weeks to f/u HTN. Risks/SE reviewed. Will need to schedule  f/u after that (3 mos) with [facility].   Urine microalb today Return in 2 weeks, fasting for labs--vit D, lipid, c-met Hep C, HIV  I spent 52 minutes dedicated to the care of this patient, including pre-visit review of records, face to face time, post-visit ordering of testing and documentation.

## 2021-08-05 ENCOUNTER — Other Ambulatory Visit: Payer: Self-pay

## 2021-08-05 ENCOUNTER — Other Ambulatory Visit: Payer: Self-pay | Admitting: *Deleted

## 2021-08-05 ENCOUNTER — Ambulatory Visit (INDEPENDENT_AMBULATORY_CARE_PROVIDER_SITE_OTHER): Payer: Medicare Other | Admitting: Family Medicine

## 2021-08-05 ENCOUNTER — Encounter: Payer: Self-pay | Admitting: Family Medicine

## 2021-08-05 ENCOUNTER — Ambulatory Visit: Payer: Medicare Other | Admitting: Family Medicine

## 2021-08-05 VITALS — BP 150/80 | HR 88 | Ht 61.5 in | Wt 306.8 lb

## 2021-08-05 DIAGNOSIS — E119 Type 2 diabetes mellitus without complications: Secondary | ICD-10-CM | POA: Diagnosis not present

## 2021-08-05 DIAGNOSIS — Z114 Encounter for screening for human immunodeficiency virus [HIV]: Secondary | ICD-10-CM

## 2021-08-05 DIAGNOSIS — E559 Vitamin D deficiency, unspecified: Secondary | ICD-10-CM | POA: Diagnosis not present

## 2021-08-05 DIAGNOSIS — Z5181 Encounter for therapeutic drug level monitoring: Secondary | ICD-10-CM

## 2021-08-05 DIAGNOSIS — E782 Mixed hyperlipidemia: Secondary | ICD-10-CM | POA: Diagnosis not present

## 2021-08-05 DIAGNOSIS — I10 Essential (primary) hypertension: Secondary | ICD-10-CM

## 2021-08-05 DIAGNOSIS — E038 Other specified hypothyroidism: Secondary | ICD-10-CM

## 2021-08-05 DIAGNOSIS — Z1159 Encounter for screening for other viral diseases: Secondary | ICD-10-CM

## 2021-08-05 DIAGNOSIS — Z23 Encounter for immunization: Secondary | ICD-10-CM | POA: Diagnosis not present

## 2021-08-05 DIAGNOSIS — R87610 Atypical squamous cells of undetermined significance on cytologic smear of cervix (ASC-US): Secondary | ICD-10-CM

## 2021-08-05 LAB — POCT GLYCOSYLATED HEMOGLOBIN (HGB A1C): Hemoglobin A1C: 6.3 % — AB (ref 4.0–5.6)

## 2021-08-05 MED ORDER — METFORMIN HCL ER 500 MG PO TB24: 500.0000 mg | ORAL_TABLET | Freq: Every day | ORAL | 1 refills | Status: DC

## 2021-08-05 MED ORDER — GLUCOSE BLOOD VI STRP: ORAL_STRIP | 4 refills | Status: DC

## 2021-08-05 MED ORDER — VALSARTAN 80 MG PO TABS
80.0000 mg | ORAL_TABLET | Freq: Every day | ORAL | 1 refills | Status: DC
Start: 2021-08-05 — End: 2021-09-03

## 2021-08-05 NOTE — Patient Instructions (Addendum)
You can try metamucil daily to see if this helps prevent any looser stools.  Please schedule your diabetes eye exam, and please ask them to send our office a report.  Low sodium diet, daily exercise and weight loss will help keep the blood pressure lower.  We are starting you on a blood pressure medication that will also help protect your kidneys from diabetes. Take it once daily (can be the same time as your metformin). Goal blood pressure is under 130/80. If you feel dizzy on the medication, try and get your blood pressure checked (can be a nurse visit at our office).  If it is truly running low, then we can cut back the dose to 1/2 pill (if <110/60)   We will need to do bloodwork  after starting this medication, so let's wait on all the labs for 2 weeks.  We will be referring you for diabetes education.  I think you are fine to wait for a repeat pap next September (1 year follow-up), rather than needing to see a gynecologist right now.

## 2021-08-06 LAB — MICROALBUMIN / CREATININE URINE RATIO
Creatinine, Urine: 200.9 mg/dL
Microalb/Creat Ratio: 6 mg/g creat (ref 0–29)
Microalbumin, Urine: 12.8 ug/mL

## 2021-08-07 ENCOUNTER — Ambulatory Visit (HOSPITAL_BASED_OUTPATIENT_CLINIC_OR_DEPARTMENT_OTHER): Payer: Medicare Other | Admitting: Medical

## 2021-08-09 DIAGNOSIS — E119 Type 2 diabetes mellitus without complications: Secondary | ICD-10-CM | POA: Diagnosis not present

## 2021-08-10 ENCOUNTER — Telehealth: Payer: Self-pay | Admitting: Internal Medicine

## 2021-08-10 NOTE — Telephone Encounter (Signed)
Pt stated that she want to cancel her procedure for 08/31/2021 at 9:30 due to transportation issues and states that she is going to have cataract surgery as well. Pt was offered the opportunity to reschedule although pt stated that she will call us back later to reschedule.  Scheduling Department notified and appointment was canceled Pt verbalized understanding with all questions answered

## 2021-08-10 NOTE — Telephone Encounter (Signed)
Patient called to cancel her hospital procedure has no transportation.

## 2021-08-19 ENCOUNTER — Other Ambulatory Visit: Payer: Medicare Other

## 2021-08-19 ENCOUNTER — Other Ambulatory Visit: Payer: Self-pay

## 2021-08-19 DIAGNOSIS — Z5181 Encounter for therapeutic drug level monitoring: Secondary | ICD-10-CM | POA: Diagnosis not present

## 2021-08-19 DIAGNOSIS — E559 Vitamin D deficiency, unspecified: Secondary | ICD-10-CM | POA: Diagnosis not present

## 2021-08-19 DIAGNOSIS — Z1159 Encounter for screening for other viral diseases: Secondary | ICD-10-CM

## 2021-08-19 DIAGNOSIS — E782 Mixed hyperlipidemia: Secondary | ICD-10-CM

## 2021-08-19 DIAGNOSIS — Z114 Encounter for screening for human immunodeficiency virus [HIV]: Secondary | ICD-10-CM | POA: Diagnosis not present

## 2021-08-20 LAB — HIV ANTIBODY (ROUTINE TESTING W REFLEX): HIV Screen 4th Generation wRfx: NONREACTIVE

## 2021-08-20 LAB — COMPREHENSIVE METABOLIC PANEL
ALT: 7 IU/L (ref 0–32)
AST: 12 IU/L (ref 0–40)
Albumin/Globulin Ratio: 1.3 (ref 1.2–2.2)
Albumin: 4 g/dL (ref 3.8–4.8)
Alkaline Phosphatase: 115 IU/L (ref 44–121)
BUN/Creatinine Ratio: 16 (ref 12–28)
BUN: 15 mg/dL (ref 8–27)
Bilirubin Total: 0.5 mg/dL (ref 0.0–1.2)
CO2: 24 mmol/L (ref 20–29)
Calcium: 9.9 mg/dL (ref 8.7–10.3)
Chloride: 101 mmol/L (ref 96–106)
Creatinine, Ser: 0.92 mg/dL (ref 0.57–1.00)
Globulin, Total: 3.2 g/dL (ref 1.5–4.5)
Glucose: 129 mg/dL — ABNORMAL HIGH (ref 70–99)
Potassium: 4.2 mmol/L (ref 3.5–5.2)
Sodium: 138 mmol/L (ref 134–144)
Total Protein: 7.2 g/dL (ref 6.0–8.5)
eGFR: 69 mL/min/{1.73_m2} (ref >59)

## 2021-08-20 LAB — LIPID PANEL
Chol/HDL Ratio: 2.8 ratio (ref 0.0–4.4)
Cholesterol, Total: 158 mg/dL (ref 100–199)
HDL: 57 mg/dL (ref >39)
LDL Chol Calc (NIH): 71 mg/dL (ref 0–99)
Triglycerides: 182 mg/dL — ABNORMAL HIGH (ref 0–149)
VLDL Cholesterol Cal: 30 mg/dL (ref 5–40)

## 2021-08-20 LAB — VITAMIN D 25 HYDROXY (VIT D DEFICIENCY, FRACTURES): Vit D, 25-Hydroxy: 35.3 ng/mL (ref 30.0–100.0)

## 2021-08-20 LAB — HEPATITIS C ANTIBODY: Hep C Virus Ab: <0.1 s/co ratio (ref 0.0–0.9)

## 2021-08-31 ENCOUNTER — Ambulatory Visit (HOSPITAL_COMMUNITY): Admit: 2021-08-31 | Payer: Medicare Other | Admitting: Internal Medicine

## 2021-08-31 ENCOUNTER — Encounter (HOSPITAL_COMMUNITY): Payer: Self-pay

## 2021-08-31 SURGERY — COLONOSCOPY WITH PROPOFOL
Anesthesia: Monitor Anesthesia Care

## 2021-09-02 ENCOUNTER — Encounter: Payer: Medicare Other | Admitting: Family Medicine

## 2021-09-02 NOTE — Progress Notes (Signed)
Chief Complaint  Patient presents with   Follow-up    One month follow up. When she sleeps on her right side she gets a tingling in her thumb, index and middle finger, so she stopped sleeping on that side. Yesterday when she was driving she experiencing the same thing. She did take the first rx of the vitamin d but it was $30, she cannot afford-she got OTC 1,000iu instead.     Patient presents for follow-up on recent labs, and chronic problems. See below for labs done prior to visit.  She is complaining of some tingling in the right thumb, index and middle fingers when she sleeps in certain positions, and noted with driving yesterday also.  She denies any wrist pain. She denies symptoms on the left side.  She noticed this starting after last appointment.  Only occurs when she sleeps on her right, tries to avoid sleeping on that side and is less often. Denies repetitive activities.  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  Hypertension:  She was started on valsartan at her December visit. She is tolerating this without side effects.  Denies dizziness, headaches, cough.  She doesn't have a blood pressure monitor at home. BP Readings from Last 3 Encounters:  09/03/21 140/80  08/05/21 (!) 150/80  05/25/21 (!) 166/99    Diabetes:  She continues to take metformin ER 551m without side effects. She was referred to DM education. She is scheduled for 10/12/21. Sugars have been running 117-133 in the mornings (once 144 after fried chicken for dinner). Evening sugars are 105-141.  Lab Results  Component Value Date   HGBA1C 6.3 (A) 08/05/2021  She had negative urine microalb in 07/2021. [facility] She was referred to ophtho for treatment of cataract.  Hyperlipidemia follow-up:  Patient is reportedly following a low-fat, low cholesterol diet.  Compliant with medications (atorvastatin) and denies medication side effects. LDL now at goal, TG remain a little  elevated, though improved. See below.  Vitamin D deficiency: level was 15.75 in 04/2021, and she was prescribed 50,000 IU 2x/week by oncologist. She took it for the first prescription (?6 weeks, if pharmacy dispensed #12), but then switched to taking 1000 IU of OTC D3 daily.  The prescription was too expensive. Levels were improved (see below). She is currently taking 1000 IU daily.       PMH, PSH, SH reviewed  Outpatient Encounter Medications as of 09/03/2021  Medication Sig Note   Blood Glucose Monitoring Suppl (ONETOUCH VERIO) w/Device KIT Check FSBS daily    cholecalciferol (VITAMIN D3) 25 MCG (1000 UNIT) tablet Take 1,000 Units by mouth daily.    glucose blood test strip Use as instructed once a day    metFORMIN (GLUCOPHAGE XR) 500 MG 24 hr tablet Take 1 tablet (500 mg total) by mouth daily with breakfast.    OneTouch Delica Lancets [id number] by Does not apply route daily.    [DISCONTINUED] atorvastatin (LIPITOR) 10 MG tablet Take 1 tablet (10 mg total) by mouth daily.    [DISCONTINUED] valsartan (DIOVAN) 80 MG tablet Take 1 tablet (80 mg total) by mouth daily.    acetaminophen (TYLENOL) 500 MG tablet Take 1,000 mg by mouth every 6 (six) hours as needed. (Patient not taking: Reported on 09/03/2021) 09/03/2021: Takes as needed    atorvastatin (LIPITOR) 10 MG tablet Take 1 tablet (10 mg total) by mouth daily.    valsartan (DIOVAN) 80 MG tablet Take 1 tablet (80  mg total) by mouth daily.    No facility-administered encounter medications on file as of 09/03/2021.   No Known Allergies  ROS:   no fever, chills, URI symptoms, headaches, dizziness, chest pain, shortness of breath, edema.  No nausea, vomiting, diarrhea. Numbness and tingling in R hand intermittently per HPI.  No weakness. No other neurologic symptoms.  Moods are good.   PHYSICAL EXAM:  BP 140/80    Pulse 68    Ht 5' 1.5" (1.562 m)    Wt (!) 302 lb 6.4 oz (137.2 kg)    BMI 56.21 kg/m   Wt Readings from Last 3  Encounters:  09/03/21 (!) 302 lb 6.4 oz (137.2 kg)  08/05/21 (!) 306 lb 12.8 oz (139.2 kg)  05/25/21 (!) 314 lb 7 oz (142.6 kg)    Pleasant, obese female in no distress HEENT: conjunctiva and sclera are clear, EOMI, wearing mask Neck: no lymphadenopathy, thyromegaly or mass Heart: regular rate and rhythm Lungs: clear bilaterally Back: no spinal or CVA tenderness Abdomen: obese, soft, nontender, no masses Extremities: no edema, 2+ pulses. Negative Tinel and Phalen on R Neuro: alert and oriented, normal strength, gait Psych: normal mood, affect, hygiene and grooming   Lab Results  Component Value Date   CHOL 158 08/19/2021   HDL 57 08/19/2021   LDLCALC 71 08/19/2021   TRIG 182 (H) 08/19/2021   CHOLHDL 2.8 08/19/2021     Chemistry      Component Value Date/Time   NA 138 08/19/2021 0826   K 4.2 08/19/2021 0826   CL 101 08/19/2021 0826   CO2 24 08/19/2021 0826   BUN 15 08/19/2021 0826   CREATININE 0.92 08/19/2021 0826   CREATININE 0.86 05/11/2021 1239      Component Value Date/Time   CALCIUM 9.9 08/19/2021 0826   ALKPHOS 115 08/19/2021 0826   AST 12 08/19/2021 0826   AST 12 (L) 05/11/2021 1239   ALT 7 08/19/2021 0826   ALT 9 05/11/2021 1239   BILITOT 0.5 08/19/2021 0826   BILITOT 0.7 05/11/2021 1239     Fasting glucose 129  Vitamin D-OH 35.3 (up from 15.74) Negative HIV and HCV Ab.   ASSESSMENT/PLAN:  Mixed hyperlipidemia - LDL improved, TG improved, still elevated. reviewed proper diet. Cont statin, consider adding fish oil  Vitamin D deficiency - Improved; continue 1000 IU daily  Hyperlipidemia associated with type 2 diabetes mellitus ([place]) - Plan: atorvastatin (LIPITOR) 10 MG tablet  Hypertension associated with diabetes ([place])  Type 2 diabetes mellitus with complications ([place]) - cont metformin. DM education as scheduled. Cont proper diet, wt loss  Essential hypertension - BP improved, stil above goal based on today's visit. She has been losing wt.  Encouraged her to monitor elsewhere; if stays >130/80, will need to increase dose - Plan: valsartan (DIOVAN) 80 MG tablet  Carpal tunnel syndrome of right wrist - suspected, normal exam. Consider trial of wrist braces  See DM education as scheduled for next month. Encouraged to add some omega-3 fish oil Patient reminded to r/s colonoscopy.  Cont current dose of valsartan, since losing weight, has appt with nutritionist. Cont low Na diet. Given sheet to record BP's, will consider getting monitor.  If so, encouraged her to either schedule NV to assess the accuracy of monitor, vs bring to her next visit.  F/u 3 month med check with Francis Gaines  I spent 31 minutes dedicated to the care of this patient, including pre-visit review of records, face to face time, post-visit ordering  of testing and documentation.

## 2021-09-03 ENCOUNTER — Ambulatory Visit (INDEPENDENT_AMBULATORY_CARE_PROVIDER_SITE_OTHER): Payer: Medicare Other | Admitting: Family Medicine

## 2021-09-03 ENCOUNTER — Encounter: Payer: Self-pay | Admitting: Family Medicine

## 2021-09-03 ENCOUNTER — Other Ambulatory Visit: Payer: Self-pay

## 2021-09-03 VITALS — BP 140/80 | HR 68 | Ht 61.5 in | Wt 302.4 lb

## 2021-09-03 DIAGNOSIS — E118 Type 2 diabetes mellitus with unspecified complications: Secondary | ICD-10-CM

## 2021-09-03 DIAGNOSIS — E559 Vitamin D deficiency, unspecified: Secondary | ICD-10-CM | POA: Diagnosis not present

## 2021-09-03 DIAGNOSIS — E782 Mixed hyperlipidemia: Secondary | ICD-10-CM | POA: Diagnosis not present

## 2021-09-03 DIAGNOSIS — E1159 Type 2 diabetes mellitus with other circulatory complications: Secondary | ICD-10-CM

## 2021-09-03 DIAGNOSIS — E1169 Type 2 diabetes mellitus with other specified complication: Secondary | ICD-10-CM

## 2021-09-03 DIAGNOSIS — I152 Hypertension secondary to endocrine disorders: Secondary | ICD-10-CM

## 2021-09-03 DIAGNOSIS — G5601 Carpal tunnel syndrome, right upper limb: Secondary | ICD-10-CM

## 2021-09-03 DIAGNOSIS — I10 Essential (primary) hypertension: Secondary | ICD-10-CM

## 2021-09-03 DIAGNOSIS — E785 Hyperlipidemia, unspecified: Secondary | ICD-10-CM

## 2021-09-03 MED ORDER — ATORVASTATIN CALCIUM 10 MG PO TABS: 10.0000 mg | ORAL_TABLET | Freq: Every day | ORAL | 1 refills | Status: DC

## 2021-09-03 MED ORDER — VALSARTAN 80 MG PO TABS: 80.0000 mg | ORAL_TABLET | Freq: Every day | ORAL | 1 refills | Status: DC

## 2021-09-03 NOTE — Patient Instructions (Addendum)
Please try and have your blood pressure checked elsewhere, if possible (pharmacy, or if a friend has a monitor). If you do check it, jot the value down on your blood sugar log. Goal is <130/80.  Continue to limit sodium in your diet, get exercise, and work on weight loss. If they are consistently >130/80, we may need to increase the dose.  Your LDL cholesterol is much better with the atorvastatin.  The triglycerides are improved, but still above goal. Getting the sugars down helps lower triglycerides.  Avoiding fried, greasy foods alsohelps, as does taking omega-3 fish oil daily (3000 mg daily). The nutritionist that you're scheduled to see may also be able to help you with your diet.  Don't forget to reschedule your colonoscopy.  I suspect  mild carpal tunnel syndrome is the cause for the tingling. You can try wearing one of the braces on your right wrist when driving or sleeping, or with any repetitive activity.

## 2021-09-17 DIAGNOSIS — H25812 Combined forms of age-related cataract, left eye: Secondary | ICD-10-CM | POA: Diagnosis not present

## 2021-10-12 ENCOUNTER — Ambulatory Visit: Payer: Medicare Other | Admitting: Dietician

## 2021-10-23 DIAGNOSIS — H25812 Combined forms of age-related cataract, left eye: Secondary | ICD-10-CM | POA: Diagnosis not present

## 2021-10-23 HISTORY — PX: CATARACT EXTRACTION: SUR2

## 2021-11-30 ENCOUNTER — Other Ambulatory Visit: Payer: Medicare Other

## 2021-12-03 ENCOUNTER — Encounter: Payer: Medicare Other | Admitting: Physician Assistant

## 2021-12-07 ENCOUNTER — Telehealth: Payer: Self-pay | Admitting: Pharmacist

## 2021-12-07 DIAGNOSIS — Z79899 Other long term (current) drug therapy: Secondary | ICD-10-CM

## 2021-12-07 NOTE — Progress Notes (Signed)
[facility])  ?                                          [facility] Quality Pharmacy Team  ?                                      Statin Quality Measure Assessment ?  ? ?12/07/2021 ? ?Haley Stanley ?1956/05/12 ?[id number] ? ?Per review of chart and payor information, patient has a diagnosis of diabetes but is not currently filling a statin prescription.  This places patient into the SUPD (Statin Use In Patients with Diabetes) measure for CMS.   ? ?Patient is on Atorvastatin '10mg'$  but it has not been filled since November of 2022.   Patient was called. HIPAA identifiers were obtained.  She said she is still taking Atorvastatin but needed a refill.  Normally, a call would have been placed to the patient's pharmacy to request a refill but she is out of refills AND is now using another Pharmacy.   ? ?Patient is getting her medications filled at [facility] in [place]. ? ?The 10-year ASCVD risk score (Arnett DK, et al., 2019) is: 21.3% ?  Values used to calculate the score: ?    Age: 66 years ?    Sex: Female ?    Is Non-Hispanic African American: Yes ?    Diabetic: Yes ?    Tobacco smoker: No ?    Systolic Blood Pressure: 778 mmHg ?    Is BP treated: Yes ?    HDL Cholesterol: 57 mg/dL ?    Total Cholesterol: 158 mg/dL ?08/19/2021 ? ?   ?Component Value Date/Time  ? CHOL 158 08/19/2021 0826  ? TRIG 182 (H) 08/19/2021 0826  ? HDL 57 08/19/2021 0826  ? CHOLHDL 2.8 08/19/2021 0826  ? [place] 71 08/19/2021 0826  ? ? ?Plan: ? ?If deemed therapeutically appropriate, please send a new prescription for Atorvastatin '10mg'$  to [facility] at [facility], [place]. ? ?Elayne Guerin, PharmD, BCACP ?[facility] Clinical Pharmacist ?[phone] ? ? ? ? ? ? ? ? ? ?

## 2021-12-08 NOTE — Progress Notes (Signed)
Spoke to pt about the refill request. She said she didn't realize a refill was sent in Jan with 1 refill. She will call the pharmacy to make sure its still there and call us back if she has any problems ?

## 2021-12-18 ENCOUNTER — Ambulatory Visit: Payer: Medicare Other

## 2021-12-25 ENCOUNTER — Ambulatory Visit (INDEPENDENT_AMBULATORY_CARE_PROVIDER_SITE_OTHER): Payer: Medicare Other

## 2021-12-25 ENCOUNTER — Encounter: Payer: Medicare Other | Admitting: Physician Assistant

## 2021-12-25 VITALS — Ht 61.0 in | Wt 309.0 lb

## 2021-12-25 DIAGNOSIS — Z0000 Encounter for general adult medical examination without abnormal findings: Secondary | ICD-10-CM | POA: Diagnosis not present

## 2021-12-25 NOTE — Patient Instructions (Signed)
[name] , ?Thank you for taking time to come for your Medicare Wellness Visit. I appreciate your ongoing commitment to your health goals. Please review the following plan we discussed and let me know if I can assist you in the future.  ? ?Screening recommendations/referrals: ?Colonoscopy: patient to schedule ?Mammogram: completed 05/08/2021 ?Bone Density: scheduled for 03/12/2022 ?Recommended yearly ophthalmology/optometry visit for glaucoma screening and checkup ?Recommended yearly dental visit for hygiene and checkup ? ?Vaccinations: ?Influenza vaccine: due 03/23/2022 ?Pneumococcal vaccine: completed 05/05/2021 ?Tdap vaccine: completed 10/30/2015, due 10/29/2025 ?Shingles vaccine: discussed   ?Covid-19: 08/05/2021, 04/09/2021, 08/20/2020, 10/19/2019, 09/28/2019 ? ?Advanced directives: Advance directive discussed with you today. . ? ?Conditions/risks identified: none ? ?Next appointment: Follow up in one year for your annual wellness visit  ? ? ?Preventive Care 66 Years and Older, Female ?Preventive care refers to lifestyle choices and visits with your health care provider that can promote health and wellness. ?What does preventive care include? ?A yearly physical exam. This is also called an annual well check. ?Dental exams once or twice a year. ?Routine eye exams. Ask your health care provider how often you should have your eyes checked. ?Personal lifestyle choices, including: ?Daily care of your teeth and gums. ?Regular physical activity. ?Eating a healthy diet. ?Avoiding tobacco and drug use. ?Limiting alcohol use. ?Practicing safe sex. ?Taking low-dose aspirin every day. ?Taking vitamin and mineral supplements as recommended by your health care provider. ?What happens during an annual well check? ?The services and screenings done by your health care provider during your annual well check will depend on your age, overall health, lifestyle risk factors, and family history of disease. ?Counseling  ?Your health care  provider may ask you questions about your: ?Alcohol use. ?Tobacco use. ?Drug use. ?Emotional well-being. ?Home and relationship well-being. ?Sexual activity. ?Eating habits. ?History of falls. ?Memory and ability to understand (cognition). ?Work and work Statistician. ?Reproductive health. ?Screening  ?You may have the following tests or measurements: ?Height, weight, and BMI. ?Blood pressure. ?Lipid and cholesterol levels. These may be checked every 5 years, or more frequently if you are over 63 years old. ?Skin check. ?Lung cancer screening. You may have this screening every year starting at age 12 if you have a 30-pack-year history of smoking and currently smoke or have quit within the past 15 years. ?Fecal occult blood test (FOBT) of the stool. You may have this test every year starting at age 79. ?Flexible sigmoidoscopy or colonoscopy. You may have a sigmoidoscopy every 5 years or a colonoscopy every 10 years starting at age 70. ?Hepatitis C blood test. ?Hepatitis B blood test. ?Sexually transmitted disease (STD) testing. ?Diabetes screening. This is done by checking your blood sugar (glucose) after you have not eaten for a while (fasting). You may have this done every 1-3 years. ?Bone density scan. This is done to screen for osteoporosis. You may have this done starting at age 65. ?Mammogram. This may be done every 1-2 years. Talk to your health care provider about how often you should have regular mammograms. ?Talk with your health care provider about your test results, treatment options, and if necessary, the need for more tests. ?Vaccines  ?Your health care provider may recommend certain vaccines, such as: ?Influenza vaccine. This is recommended every year. ?Tetanus, diphtheria, and acellular pertussis (Tdap, Td) vaccine. You may need a Td booster every 10 years. ?Zoster vaccine. You may need this after age 69. ?Pneumococcal 13-valent conjugate (PCV13) vaccine. One dose is recommended after age  42. ?Pneumococcal  polysaccharide (PPSV23) vaccine. One dose is recommended after age 63. ?Talk to your health care provider about which screenings and vaccines you need and how often you need them. ?This information is not intended to replace advice given to you by your health care provider. Make sure you discuss any questions you have with your health care provider. ?Document Released: 09/05/2015 Document Revised: 04/28/2016 Document Reviewed: 06/10/2015 ?Elsevier Interactive Patient Education ? 2017 [place]. ? ?Fall Prevention in the Home ?Falls can cause injuries. They can happen to people of all ages. There are many things you can do to make your home safe and to help prevent falls. ?What can I do on the outside of my home? ?Regularly fix the edges of walkways and driveways and fix any cracks. ?Remove anything that might make you trip as you walk through a door, such as a raised step or threshold. ?Trim any bushes or trees on the path to your home. ?Use bright outdoor lighting. ?Clear any walking paths of anything that might make someone trip, such as rocks or tools. ?Regularly check to see if handrails are loose or broken. Make sure that both sides of any steps have handrails. ?Any raised decks and porches should have guardrails on the edges. ?Have any leaves, snow, or ice cleared regularly. ?Use sand or salt on walking paths during winter. ?Clean up any spills in your garage right away. This includes oil or grease spills. ?What can I do in the bathroom? ?Use night lights. ?Install grab bars by the toilet and in the tub and shower. Do not use towel bars as grab bars. ?Use non-skid mats or decals in the tub or shower. ?If you need to sit down in the shower, use a plastic, non-slip stool. ?Keep the floor dry. Clean up any water that spills on the floor as soon as it happens. ?Remove soap buildup in the tub or shower regularly. ?Attach bath mats securely with double-sided non-slip rug tape. ?Do not have throw  rugs and other things on the floor that can make you trip. ?What can I do in the bedroom? ?Use night lights. ?Make sure that you have a light by your bed that is easy to reach. ?Do not use any sheets or blankets that are too big for your bed. They should not hang down onto the floor. ?Have a firm chair that has side arms. You can use this for support while you get dressed. ?Do not have throw rugs and other things on the floor that can make you trip. ?What can I do in the kitchen? ?Clean up any spills right away. ?Avoid walking on wet floors. ?Keep items that you use a lot in easy-to-reach places. ?If you need to reach something above you, use a strong step stool that has a grab bar. ?Keep electrical cords out of the way. ?Do not use floor polish or wax that makes floors slippery. If you must use wax, use non-skid floor wax. ?Do not have throw rugs and other things on the floor that can make you trip. ?What can I do with my stairs? ?Do not leave any items on the stairs. ?Make sure that there are handrails on both sides of the stairs and use them. Fix handrails that are broken or loose. Make sure that handrails are as long as the stairways. ?Check any carpeting to make sure that it is firmly attached to the stairs. Fix any carpet that is loose or worn. ?Avoid having throw rugs at the top or  bottom of the stairs. If you do have throw rugs, attach them to the floor with carpet tape. ?Make sure that you have a light switch at the top of the stairs and the bottom of the stairs. If you do not have them, ask someone to add them for you. ?What else can I do to help prevent falls? ?Wear shoes that: ?Do not have high heels. ?Have rubber bottoms. ?Are comfortable and fit you well. ?Are closed at the toe. Do not wear sandals. ?If you use a stepladder: ?Make sure that it is fully opened. Do not climb a closed stepladder. ?Make sure that both sides of the stepladder are locked into place. ?Ask someone to hold it for you, if  possible. ?Clearly mark and make sure that you can see: ?Any grab bars or handrails. ?First and last steps. ?Where the edge of each step is. ?Use tools that help you move around (mobility aids) if they are needed. These incl

## 2021-12-25 NOTE — Progress Notes (Addendum)
?I connected with  [name] today via telehealth video enabled device and verified that I am speaking with the correct person using two identifiers. ?  ?Location: ?Patient: home ?Provider: work ? ?Persons participating in virtual visit: Shakura, Cowing LPN ? ?I discussed the limitations, risks, security and privacy concerns of performing an evaluation and management service by video and the availability of in person appointments. The patient expressed understanding and agreed to proceed. ?  ?Some vital signs may be absent or patient reported.  ?  ? ?Subjective:  ? Haley Stanley is a 66 y.o. female who presents for an Initial Medicare Annual Wellness Visit. ? ?Review of Systems    ? ?Cardiac Risk Factors include: advanced age (>70mn, >>69women);diabetes mellitus;dyslipidemia;obesity (BMI >30kg/m2) ? ?   ?Objective:  ?  ?Today's Vitals  ? 12/25/21 0244905/05/23 [id number]  ?Weight: (!) 309 lb (140.2 kg)   ?Height: '5\' 1"'  (1.549 m)   ?PainSc:  4   ? ?Body mass index is 58.39 kg/m?. ? ? ?  12/25/2021  ?  9:46 AM 10/29/2015  ? 11:54 PM  ?Advanced Directives  ?Does Patient Have a Medical Advance Directive? No No  ? ? ?Current Medications (verified) ?Outpatient Encounter Medications as of 12/25/2021  ?Medication Sig  ? acetaminophen (TYLENOL) 500 MG tablet Take 1,000 mg by mouth every 6 (six) hours as needed.  ? atorvastatin (LIPITOR) 10 MG tablet Take 1 tablet (10 mg total) by mouth daily.  ? Blood Glucose Monitoring Suppl (ONETOUCH VERIO) w/Device KIT Check FSBS daily  ? cholecalciferol (VITAMIN D3) 25 MCG (1000 UNIT) tablet Take 1,000 Units by mouth daily.  ? glucose blood test strip Use as instructed once a day  ? metFORMIN (GLUCOPHAGE XR) 500 MG 24 hr tablet Take 1 tablet (500 mg total) by mouth daily with breakfast.  ? Omega-3 Fatty Acids (FISH OIL) 1200 MG CAPS Take 2 capsules by mouth daily.  ? OneTouch Delica Lancets 375PMISC 1005applicators by Does not apply route daily.  ? valsartan (DIOVAN) 80 MG tablet  Take 1 tablet (80 mg total) by mouth daily.  ? ?No facility-administered encounter medications on file as of 12/25/2021.  ? ? ?Allergies (verified) ?Patient has no known allergies.  ? ?History: ?Past Medical History:  ?Diagnosis Date  ? Asthma   ? Atypical squamous cell changes of undetermined significance (ASCUS) on cervical cytology with negative high risk human papilloma virus (HPV) test result 05/27/2021  ? Diabetes mellitus, [place] onset [place] 04/29/[place]21  ? Hyperlipidemia associated with type 2 diabetes mellitus [place] 04/29/2021  ? Hypertension   ? Obesity   ? 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 Seen on pap smear   ? Subclinical hypothyroidism 04/29/2021  ? Trichomonas infection 05/27/2021  ? ?Past Surgical History:  ?Procedure Laterality Date  ? CATARACT EXTRACTION Left 10/23/2021  ? gallstone removal    ? KNEE SURGERY    ? ?Family History  ?Problem Relation Age of Onset  ? Heart failure Mother   ? Breast cancer Neg Hx   ? ?Social History  ? ?Socioeconomic History  ? Marital status: Single  ?  Spouse name: Not on file  ? Number of children: Not on file  ? Years of education: Not on file  ? Highest education level: Not on file  ?Occupational History  ? Not on file  ?Tobacco Use  ? Smoking status: Never  ? Smokeless tobacco: Never  ?Vaping Use  ? Vaping Use: Never used  ?Substance and Sexual Activity  ?  Alcohol use: No  ? Drug use: No  ? Sexual activity: Not on file  ?Other Topics Concern  ? Not on file  ?Social History Narrative  ? Not on file  ? ?Social Determinants of Health  ? ?Financial Resource Strain: Low Risk   ? Difficulty of Paying Living Expenses: Not hard at all  ?Food Insecurity: No Food Insecurity  ? Worried About Charity fundraiser in the Last Year: Never true  ? Ran Out of Food in the Last Year: Never true  ?Transportation Needs: No Transportation Needs  ? Lack of Transportation (Medical): No  ? Lack of Transportation (Non-Medical): No  ?Physical Activity: Inactive  ? Days of Exercise per Week: 0 days  ? Minutes of  Exercise per Session: 0 min  ?Stress: No Stress Concern Present  ? Feeling of Stress : Only a little  ?Social Connections: Not on file  ? ? ?Tobacco Counseling ?Counseling given: Not Answered ? ? ?Clinical Intake: ? ?Pre-visit preparation completed: Yes ? ?Pain : 0-10 ?Pain Score: 4  ?Pain Type: Chronic pain ?Pain Location: Back ?Pain Orientation: Lower ?Pain Descriptors / Indicators: Aching, Pressure, Constant ?Pain Onset: More than a month ago ?Pain Frequency: Constant ? ?  ? ?Nutritional Status: BMI > 30  Obese ?Nutritional Risks: None ?Diabetes: Yes ? ?How often do you need to have someone help you when you read instructions, pamphlets, or other written materials from your doctor or pharmacy?: 1 - Never ?What is the last grade level you completed in school?: 12th grade ? ?Diabetic? Yes ?Nutrition Risk Assessment: ? ?Has the patient had any N/V/D within the last 2 months?  No  ?Does the patient have any non-healing wounds?  No  ?Has the patient had any unintentional weight loss or weight gain?  No  ? ?Diabetes: ? ?Is the patient diabetic?  Yes  ?If diabetic, was a CBG obtained today?  No  ?Did the patient bring in their glucometer from home?  No  ?How often do you monitor your CBG's? daily.  ? ?Financial Strains and Diabetes Management: ? ?Are you having any financial strains with the device, your supplies or your medication? No .  ?Does the patient want to be seen by Chronic Care Management for management of their diabetes?  No  ?Would the patient like to be referred to a Nutritionist or for Diabetic Management?  No  ? ?Diabetic Exams: ? ?Diabetic Eye Exam: Completed 08/09/2021 ?Diabetic Foot Exam: Completed 08/05/2021 ? ? ?Interpreter Needed?: No ? ?Information entered by :: NAllen LPN ? ? ?Activities of Daily Living ? ?  12/25/2021  ?  9:47 AM 12/24/2021  ?  1:26 PM  ?In your present state of health, do you have any difficulty performing the following activities:  ?Hearing? 0 0  ?Vision? 0 0  ?Difficulty  concentrating or making decisions? 0 0  ?Walking or climbing stairs? 1 1  ?Dressing or bathing? 0 0  ?Doing errands, shopping? 0 0  ?Preparing Food and eating ? N N  ?Using the Toilet? N N  ?In the past six months, have you accidently leaked urine? Y Y  ?Do you have problems with loss of bowel control? N N  ?Managing your Medications? N N  ?Managing your Finances? N N  ?Housekeeping or managing your Housekeeping? N N  ? ? ?Patient Care Team: ?Marcellina Millin as PCP - General (Physician Assistant) ? ?Indicate any recent Medical Services you may have received from other than [facility] providers in the  past year (date may be approximate). ? ?   ?Assessment:  ? This is a routine wellness examination for Rylann. ? ?Hearing/Vision screen ?Vision Screening - Comments:: Regular eye exams, [facility] ? ?Dietary issues and exercise activities discussed: ?Current Exercise Habits: The patient does not participate in regular exercise at present ? ? Goals Addressed   ? ?  ?  ?  ?  ? This Visit's Progress  ?  Patient Stated     ?  12/25/2021, continue to lose weight ?  ? ?  ? ?Depression Screen ? ?  12/25/2021  ?  9:47 AM 04/28/2021  ? 10:31 AM  ?PHQ 2/9 Scores  ?PHQ - 2 Score 0 0  ?PHQ- 9 Score 0   ?  ?Fall Risk ? ?  12/25/2021  ?  9:46 AM 12/24/2021  ?  1:26 PM 12/21/2021  ?  9:35 AM 09/03/2021  ? 10:57 AM 08/05/2021  ?  3:06 PM  ?Fall Risk   ?Falls in the past year? 0 0 0 0 0  ?Number falls in past yr: 0   0 0  ?Injury with Fall? 0   0 0  ?Risk for fall due to : Medication side effect   No Fall Risks No Fall Risks  ?Follow up Falls evaluation completed;Education provided;Falls prevention discussed   Falls evaluation completed Falls evaluation completed  ? ? ?FALL RISK PREVENTION PERTAINING TO THE HOME: ? ?Any stairs in or around the home? No  ?If so, are there any without handrails? /a ?Home free of loose throw rugs in walkways, pet beds, electrical cords, etc? Yes  ?Adequate lighting in your home to reduce risk of falls? Yes   ? ?ASSISTIVE DEVICES UTILIZED TO PREVENT FALLS: ? ?Life alert? No  ?Use of a cane, walker or w/c? Yes  ?Grab bars in the bathroom? Yes  ?Shower chair or bench in shower? No  ?Elevated toilet seat or a handicapped toilet? No

## 2021-12-31 NOTE — Progress Notes (Signed)
? ?Established Patient Office Visit ? ?Subjective:  ?Patient ID: Haley Stanley, female    DOB: Jun 26, 1956  Age: 66 y.o. MRN: [id number] ? ?CC:  ?Chief Complaint  ?Patient presents with  ? Medication Refill  ?  Medcheck- no other concerns. Will like 1st shingles shot  she has bcbs  ? ? ?HPI ?Haley Stanley presents for HTN and DM 2 follow up; is tolerating her medicines  ?Checks blood every day, 4 days in the morning and 3 days in the afternoon ?Bs at home range from 107 to 150, usually in the 120s ?Doesn't check blood pressure at home ?Ophthalmology exam - [facility] 10/2021 ? ?Outpatient Medications Prior to Visit  ?Medication Sig Dispense Refill  ? acetaminophen (TYLENOL) 500 MG tablet Take 1,000 mg by mouth every 6 (six) hours as needed.    ? Blood Glucose Monitoring Suppl (ONETOUCH VERIO) w/Device KIT Check FSBS daily 1 kit 0  ? cholecalciferol (VITAMIN D3) 25 MCG (1000 UNIT) tablet Take 1,000 Units by mouth daily.    ? glucose blood test strip Use as instructed once a day 100 each 4  ? Omega-3 Fatty Acids (FISH OIL) 1200 MG CAPS Take 2 capsules by mouth daily.    ? OneTouch Delica Lancets 82L MISC 078 applicators by Does not apply route daily. 100 each 2  ? atorvastatin (LIPITOR) 10 MG tablet Take 1 tablet (10 mg total) by mouth daily. 90 tablet 1  ? metFORMIN (GLUCOPHAGE XR) 500 MG 24 hr tablet Take 1 tablet (500 mg total) by mouth daily with breakfast. 90 tablet 1  ? valsartan (DIOVAN) 80 MG tablet Take 1 tablet (80 mg total) by mouth daily. 90 tablet 1  ? ?No facility-administered medications prior to visit.  ? ? ?No Known Allergies ? ?Patient Care Team: ?Marcellina Millin as PCP - General (Physician Assistant) ? ?ROS ?Review of Systems  ?Constitutional:  Negative for activity change and chills.  ?HENT:  Negative for congestion and voice change.   ?Eyes:  Negative for pain and redness.  ?Respiratory:  Negative for cough and wheezing.   ?Cardiovascular:  Negative for chest pain.  ?Gastrointestinal:   Negative for constipation, diarrhea, nausea and vomiting.  ?Endocrine: Negative for polyuria.  ?Genitourinary:  Negative for frequency.  ?Skin:  Negative for color change and rash.  ?Allergic/Immunologic: Negative for immunocompromised state.  ?Neurological:  Negative for dizziness.  ?Psychiatric/Behavioral:  Negative for agitation.   ? ?  ?Objective:  ?  ?Physical Exam ?Vitals and nursing note reviewed.  ?Constitutional:   ?   General: She is not in acute distress. ?   Appearance: She is normal weight. She is not ill-appearing.  ?HENT:  ?   Head: Normocephalic and atraumatic.  ?   Right Ear: External ear normal.  ?   Left Ear: External ear normal.  ?Eyes:  ?   Extraocular Movements: Extraocular movements intact.  ?   Conjunctiva/sclera: Conjunctivae normal.  ?   Pupils: Pupils are equal, round, and reactive to light.  ?Cardiovascular:  ?   Rate and Rhythm: Normal rate and regular rhythm.  ?Pulmonary:  ?   Effort: Pulmonary effort is normal.  ?   Breath sounds: Normal breath sounds. No wheezing.  ?Abdominal:  ?   General: Bowel sounds are normal.  ?   Palpations: Abdomen is soft.  ?Musculoskeletal:     ?   General: Normal range of motion.  ?   Cervical back: Normal range of motion.  ?Skin: ?  General: Skin is warm and dry.  ?Neurological:  ?   Mental Status: She is alert and oriented to person, place, and time.  ?Psychiatric:     ?   Mood and Affect: Mood normal.  ? ? ?BP 130/88   Pulse 75   Ht '5\' 1"'  (1.549 m)   Wt (!) 306 lb 6.4 oz (139 kg)   SpO2 97%   BMI 57.89 kg/m?  ? ?Wt Readings from Last 3 Encounters:  ?01/01/22 (!) 306 lb 6.4 oz (139 kg)  ?12/25/21 (!) 309 lb (140.2 kg)  ?09/03/21 (!) 302 lb 6.4 oz (137.2 kg)  ? ? ?Results for orders placed or performed in visit on 08/19/21  ?Comprehensive metabolic panel  ?Result Value Ref Range  ? Glucose 129 (H) 70 - 99 mg/dL  ? BUN 15 8 - 27 mg/dL  ? Creatinine, Ser 0.92 0.57 - 1.00 mg/dL  ? eGFR 69 >59 mL/min/1.73  ? BUN/Creatinine Ratio 16 12 - 28  ? Sodium 138  134 - 144 mmol/L  ? Potassium 4.2 3.5 - 5.2 mmol/L  ? Chloride 101 96 - 106 mmol/L  ? CO2 24 20 - 29 mmol/L  ? Calcium 9.9 8.7 - 10.3 mg/dL  ? Total Protein 7.2 6.0 - 8.5 g/dL  ? Albumin 4.0 3.8 - 4.8 g/dL  ? Globulin, Total 3.2 1.5 - 4.5 g/dL  ? Albumin/Globulin Ratio 1.3 1.2 - 2.2  ? Bilirubin Total 0.5 0.0 - 1.2 mg/dL  ? Alkaline Phosphatase 115 44 - 121 IU/L  ? AST 12 0 - 40 IU/L  ? ALT 7 0 - 32 IU/L  ?HIV Antibody (routine testing w rflx)  ?Result Value Ref Range  ? HIV Screen 4th Generation wRfx Non Reactive Non Reactive  ?Hepatitis C antibody  ?Result Value Ref Range  ? Hep C Virus Ab <0.1 0.0 - 0.9 s/co ratio  ?VITAMIN D 25 Hydroxy (Vit-D Deficiency, Fractures)  ?Result Value Ref Range  ? Vit D, 25-Hydroxy 35.3 30.0 - 100.0 ng/mL  ?Lipid panel  ?Result Value Ref Range  ? Cholesterol, Total 158 100 - 199 mg/dL  ? Triglycerides 182 (H) 0 - 149 mg/dL  ? HDL 57 >39 mg/dL  ? VLDL Cholesterol Cal 30 5 - 40 mg/dL  ? LDL Chol Calc (NIH) 71 0 - 99 mg/dL  ? Chol/HDL Ratio 2.8 0.0 - 4.4 ratio  ?  ? ? ? ?The 10-year ASCVD risk score (Arnett DK, et al., 2019) is: 18.2% ? ?  ?Assessment & Plan:  ? ?Problem List Items Addressed This Visit   ? ?  ? Cardiovascular and Mediastinum  ? Essential hypertension  ? Relevant Medications  ? valsartan (DIOVAN) 80 MG tablet  ? atorvastatin (LIPITOR) 10 MG tablet  ?  ? Endocrine  ? Hyperlipidemia associated with type 2 diabetes mellitus ([place])  ?  controlled, continue Lipitor 10 mg qd, eat a low fat diet, increase fiber intake (Benefiber or Metamucil, Cherrios,  oatmeal, beans, nuts, fruits and vegetables), limit saturated fats (in fried foods, red meat), can add OTC fish oil supplement, eat fish with Omega-3 fatty acids like salmon and tuna, exercise for 30 minutes 3 - 5 times a week, drink 8 - 10 glasses of water a day ? ? ?  ?  ? Relevant Medications  ? valsartan (DIOVAN) 80 MG tablet  ? metFORMIN (GLUCOPHAGE XR) 500 MG 24 hr tablet  ? atorvastatin (LIPITOR) 10 MG tablet  ? Type 2  diabetes mellitus with hyperglycemia, without long-term current  use of insulin ([place]) - Primary  ?  hgb a1c checked today, controlled, eat a low sugar diet, avoid starchy food with a lot of carbohydrates, avoid fried and processed foods; last hgb a1c 6.3 on 08/05/2021 ? ?  ?  ? Relevant Medications  ? valsartan (DIOVAN) 80 MG tablet  ? metFORMIN (GLUCOPHAGE XR) 500 MG 24 hr tablet  ? atorvastatin (LIPITOR) 10 MG tablet  ? Other Relevant Orders  ? Hemoglobin A1c  ? ?Other Visit Diagnoses   ? ? Need for shingles vaccine      ? Relevant Orders  ? Varicella-zoster vaccine IM (Shingrix) (Completed)  ? ?  ? ? ?Meds ordered this encounter  ?Medications  ? valsartan (DIOVAN) 80 MG tablet  ?  Sig: Take 1 tablet (80 mg total) by mouth daily.  ?  Dispense:  90 tablet  ?  Refill:  2  ?  Order Specific Question:   Supervising Provider  ?  Answer:   Denita Lung [[id number]]  ? metFORMIN (GLUCOPHAGE XR) 500 MG 24 hr tablet  ?  Sig: Take 1 tablet (500 mg total) by mouth daily with breakfast.  ?  Dispense:  90 tablet  ?  Refill:  2  ?  Order Specific Question:   Supervising Provider  ?  Answer:   Denita Lung [[id number]]  ? atorvastatin (LIPITOR) 10 MG tablet  ?  Sig: Take 1 tablet (10 mg total) by mouth daily.  ?  Dispense:  90 tablet  ?  Refill:  2  ?  Order Specific Question:   Supervising Provider  ?  Answer:   Denita Lung [[id number]]  ? ? ?Follow-up: Return in about 6 months (around 07/04/2022) for Return for Annual Exam with PCP Jimmye Norman.  ? ? ?Irene Pap, PA-C ?

## 2022-01-01 ENCOUNTER — Ambulatory Visit (INDEPENDENT_AMBULATORY_CARE_PROVIDER_SITE_OTHER): Payer: Medicare Other | Admitting: Physician Assistant

## 2022-01-01 ENCOUNTER — Encounter: Payer: Self-pay | Admitting: Physician Assistant

## 2022-01-01 VITALS — BP 130/88 | HR 75 | Ht 61.0 in | Wt 306.4 lb

## 2022-01-01 DIAGNOSIS — E1165 Type 2 diabetes mellitus with hyperglycemia: Secondary | ICD-10-CM | POA: Diagnosis not present

## 2022-01-01 DIAGNOSIS — I10 Essential (primary) hypertension: Secondary | ICD-10-CM | POA: Diagnosis not present

## 2022-01-01 DIAGNOSIS — E1159 Type 2 diabetes mellitus with other circulatory complications: Secondary | ICD-10-CM | POA: Insufficient documentation

## 2022-01-01 DIAGNOSIS — E785 Hyperlipidemia, unspecified: Secondary | ICD-10-CM

## 2022-01-01 DIAGNOSIS — Z23 Encounter for immunization: Secondary | ICD-10-CM

## 2022-01-01 DIAGNOSIS — I152 Hypertension secondary to endocrine disorders: Secondary | ICD-10-CM | POA: Insufficient documentation

## 2022-01-01 DIAGNOSIS — E1169 Type 2 diabetes mellitus with other specified complication: Secondary | ICD-10-CM

## 2022-01-01 LAB — HEMOGLOBIN A1C
Est. average glucose Bld gHb Est-mCnc: 140 mg/dL
Hgb A1c MFr Bld: 6.5 % — ABNORMAL HIGH (ref 4.8–5.6)

## 2022-01-01 MED ORDER — VALSARTAN 80 MG PO TABS: 80.0000 mg | ORAL_TABLET | Freq: Every day | ORAL | 2 refills | Status: DC

## 2022-01-01 MED ORDER — METFORMIN HCL ER 500 MG PO TB24: 500.0000 mg | ORAL_TABLET | Freq: Every day | ORAL | 2 refills | Status: DC

## 2022-01-01 MED ORDER — ATORVASTATIN CALCIUM 10 MG PO TABS: 10.0000 mg | ORAL_TABLET | Freq: Every day | ORAL | 2 refills | Status: DC

## 2022-01-01 NOTE — Assessment & Plan Note (Signed)
hgb a1c checked today, controlled, eat a low sugar diet, avoid starchy food with a lot of carbohydrates, avoid fried and processed foods; last hgb a1c 6.3 on 08/05/2021 ?

## 2022-01-01 NOTE — Assessment & Plan Note (Signed)
controlled, continue Lipitor 10 mg qd, eat a low fat diet, increase fiber intake (Benefiber or Metamucil, Cherrios,  oatmeal, beans, nuts, fruits and vegetables), limit saturated fats (in fried foods, red meat), can add OTC fish oil supplement, eat fish with Omega-3 fatty acids like salmon and tuna, exercise for 30 minutes 3 - 5 times a week, drink 8 - 10 glasses of water a day ? ? ?

## 2022-02-19 ENCOUNTER — Encounter: Payer: Self-pay | Admitting: Internal Medicine

## 2022-03-12 ENCOUNTER — Ambulatory Visit
Admission: RE | Admit: 2022-03-12 | Discharge: 2022-03-12 | Disposition: A | Discharge status: Home / Self Care | Payer: Medicare Other | Source: Ambulatory Visit | Attending: Family Medicine | Admitting: Family Medicine

## 2022-03-12 ENCOUNTER — Other Ambulatory Visit: Payer: Self-pay | Admitting: Family Medicine

## 2022-03-12 DIAGNOSIS — N6489 Other specified disorders of breast: Secondary | ICD-10-CM

## 2022-03-12 DIAGNOSIS — R928 Other abnormal and inconclusive findings on diagnostic imaging of breast: Secondary | ICD-10-CM | POA: Diagnosis not present

## 2022-03-12 DIAGNOSIS — C7951 Secondary malignant neoplasm of bone: Secondary | ICD-10-CM

## 2022-03-12 DIAGNOSIS — Z780 Asymptomatic menopausal state: Secondary | ICD-10-CM | POA: Diagnosis not present

## 2022-03-12 DIAGNOSIS — E2839 Other primary ovarian failure: Secondary | ICD-10-CM

## 2022-04-28 ENCOUNTER — Encounter: Payer: Self-pay | Admitting: Internal Medicine

## 2022-06-01 ENCOUNTER — Encounter: Payer: Self-pay | Admitting: Internal Medicine

## 2022-06-15 DIAGNOSIS — M25512 Pain in left shoulder: Secondary | ICD-10-CM | POA: Diagnosis not present

## 2022-07-05 ENCOUNTER — Ambulatory Visit: Payer: Medicare Other | Admitting: Physician Assistant

## 2022-07-06 ENCOUNTER — Encounter: Payer: Self-pay | Admitting: Internal Medicine

## 2022-07-06 DIAGNOSIS — M24812 Other specific joint derangements of left shoulder, not elsewhere classified: Secondary | ICD-10-CM | POA: Diagnosis not present

## 2022-07-06 DIAGNOSIS — M19012 Primary osteoarthritis, left shoulder: Secondary | ICD-10-CM | POA: Diagnosis not present

## 2022-09-09 ENCOUNTER — Encounter: Payer: Medicare Other | Admitting: Nurse Practitioner

## 2022-10-22 ENCOUNTER — Encounter: Payer: Medicare Other | Admitting: Nurse Practitioner

## 2022-11-05 ENCOUNTER — Encounter: Payer: Self-pay | Admitting: Family Medicine

## 2022-11-08 ENCOUNTER — Encounter: Payer: Self-pay | Admitting: Nurse Practitioner

## 2022-11-08 ENCOUNTER — Telehealth: Payer: Self-pay | Admitting: Nurse Practitioner

## 2022-11-08 NOTE — Telephone Encounter (Signed)
Lilia Pro from [facility] at the breast center called and said patient is being seen for breast imaging and will need 6 month follow up.  [place]

## 2022-11-09 NOTE — Telephone Encounter (Signed)
Can you call and clarify what is needed? Do they need [place] to make a 6 month appointment with the patient, a 6 month appointment for the breast center? I am not sure what to do with this.

## 2022-11-10 ENCOUNTER — Encounter: Payer: Self-pay | Admitting: Family Medicine

## 2022-11-10 ENCOUNTER — Other Ambulatory Visit: Payer: Self-pay | Admitting: Nurse Practitioner

## 2022-11-10 DIAGNOSIS — N6489 Other specified disorders of breast: Secondary | ICD-10-CM

## 2022-11-11 ENCOUNTER — Encounter: Payer: Self-pay | Admitting: Family Medicine

## 2022-11-11 ENCOUNTER — Ambulatory Visit
Admission: RE | Admit: 2022-11-11 | Discharge: 2022-11-11 | Disposition: A | Discharge status: Home / Self Care | Payer: Medicare Other | Source: Ambulatory Visit | Attending: Nurse Practitioner | Admitting: Nurse Practitioner

## 2022-11-11 ENCOUNTER — Other Ambulatory Visit: Payer: Self-pay | Admitting: Nurse Practitioner

## 2022-11-11 ENCOUNTER — Encounter: Payer: Self-pay | Admitting: Nurse Practitioner

## 2022-11-11 ENCOUNTER — Ambulatory Visit
Admission: RE | Admit: 2022-11-11 | Discharge: 2022-11-11 | Disposition: A | Discharge status: Home / Self Care | Payer: Medicare Other | Source: Ambulatory Visit | Attending: Family Medicine | Admitting: Family Medicine

## 2022-11-11 DIAGNOSIS — N6489 Other specified disorders of breast: Secondary | ICD-10-CM

## 2022-11-29 ENCOUNTER — Encounter: Payer: Self-pay | Admitting: Nurse Practitioner

## 2022-11-29 ENCOUNTER — Ambulatory Visit (INDEPENDENT_AMBULATORY_CARE_PROVIDER_SITE_OTHER): Payer: Medicare Other | Admitting: Nurse Practitioner

## 2022-11-29 VITALS — BP 134/84 | HR 78 | Wt 322.0 lb

## 2022-11-29 DIAGNOSIS — I152 Hypertension secondary to endocrine disorders: Secondary | ICD-10-CM

## 2022-11-29 DIAGNOSIS — E1165 Type 2 diabetes mellitus with hyperglycemia: Secondary | ICD-10-CM | POA: Diagnosis not present

## 2022-11-29 DIAGNOSIS — E785 Hyperlipidemia, unspecified: Secondary | ICD-10-CM | POA: Diagnosis not present

## 2022-11-29 DIAGNOSIS — Z1211 Encounter for screening for malignant neoplasm of colon: Secondary | ICD-10-CM | POA: Diagnosis not present

## 2022-11-29 DIAGNOSIS — E1169 Type 2 diabetes mellitus with other specified complication: Secondary | ICD-10-CM

## 2022-11-29 DIAGNOSIS — I10 Essential (primary) hypertension: Secondary | ICD-10-CM

## 2022-11-29 DIAGNOSIS — E1159 Type 2 diabetes mellitus with other circulatory complications: Secondary | ICD-10-CM

## 2022-11-29 DIAGNOSIS — Z23 Encounter for immunization: Secondary | ICD-10-CM | POA: Diagnosis not present

## 2022-11-29 DIAGNOSIS — E559 Vitamin D deficiency, unspecified: Secondary | ICD-10-CM

## 2022-11-29 DIAGNOSIS — M19019 Primary osteoarthritis, unspecified shoulder: Secondary | ICD-10-CM | POA: Insufficient documentation

## 2022-11-29 DIAGNOSIS — E038 Other specified hypothyroidism: Secondary | ICD-10-CM

## 2022-11-29 LAB — LIPID PANEL

## 2022-11-29 MED ORDER — CELECOXIB 100 MG PO CAPS: 100.0000 mg | ORAL_CAPSULE | Freq: Two times a day (BID) | ORAL | 3 refills | Status: DC

## 2022-11-29 MED ORDER — METFORMIN HCL ER 500 MG PO TB24: 500.0000 mg | ORAL_TABLET | Freq: Every day | ORAL | 3 refills | Status: DC

## 2022-11-29 MED ORDER — ONETOUCH DELICA LANCETS 33G MISC: 100.0000 | Freq: Every day | 6 refills | Status: AC

## 2022-11-29 MED ORDER — GLUCOSE BLOOD VI STRP: ORAL_STRIP | 6 refills | Status: DC

## 2022-11-29 MED ORDER — ATORVASTATIN CALCIUM 10 MG PO TABS: 10.0000 mg | ORAL_TABLET | Freq: Every day | ORAL | 3 refills | Status: DC

## 2022-11-29 MED ORDER — VALSARTAN 80 MG PO TABS: 80.0000 mg | ORAL_TABLET | Freq: Every day | ORAL | 3 refills | Status: DC

## 2022-11-29 NOTE — Patient Instructions (Addendum)
Today you received your second Shingles vaccine. You may feel sore in the arm and a little achy for the next day. This will pass quickly.   I will be in touch with you about your labs.   I have sent the referral for colonoscopy in for you.

## 2022-11-29 NOTE — Assessment & Plan Note (Signed)
She is currently on atorvastatin for management without any reported side effects.  Plan: - Continue atorvastatin. Refill provided.  - Labs pending today - Continue to follow low saturated fat, low carbohydrate diet.

## 2022-11-29 NOTE — Assessment & Plan Note (Signed)
Fracture of left shoulder last fall. Patient reports persistent pain despite trying Tylenol, Advil, pain pills, Voltaren, and BioFreeze. Cortisone injections were also administered without relief. Plan: - Prescribe Celebrex (COX-2 inhibitor) to help with inflammation and pain. - Instruct patient to follow up if no improvement or if side effects occur.

## 2022-11-29 NOTE — Assessment & Plan Note (Signed)
She is monitoring her blood sugars at home and reports they are good. She is currently on metformin 500mg  for management. No new symptoms or concerns today. Plan: - continue metformin and monitoring of blood sugar at least once every day.  - Refills provided on medication and supplies - Labs pending.

## 2022-11-29 NOTE — Progress Notes (Signed)
Haley Clamp, DNP, AGNP-c [facility]  [address] [place], [place] [address] [phone]  ESTABLISHED PATIENT- Chronic Health and/or Follow-Up Visit  Blood pressure 134/84, pulse 78, weight (!) 322 lb (146.1 kg).    Haley Stanley is a 67 y.o. year old female presenting today for evaluation and management of chronic conditions.  Sincere presents today for management of chronic conditions including diabetes, htn, and hld.   She also expresses concerns of persistent left shoulder pain following a fracture in November. Despite the fracture healing, she reports that arthritis has developed in the area, causing significant discomfort. Haley Stanley notes that exercises recommended to alleviate the pain actually exacerbate it, making daily activities like dressing extremely difficult. She has tried various pain relief methods, including Tylenol, Advil, prescription pain pills, Voltaren, and BioFreeze, but none have provided relief. She also mentions that cortisone injections did not alleviate the pain. Haley Stanley is particularly troubled by the constant pain that disrupts her sleep, stating she is tired due to the lack of rest.  She reveals that the shoulder pain has significantly impacted her life, forcing her to stop working almost a year ago due to the limitations it imposes on her mobility and activities.  Haley Stanley also has arthritis in her knee, but she mentions that Tylenol and naproxen can somewhat manage the pain in her knee, unlike her shoulder.  Regarding her general health, Haley Stanley confirms she checks her blood sugar levels at home, which have been good. She denies any recent kidney or urinary infections, skin issues, or sores. Haley Stanley has not had a colonoscopy but acknowledges the need for one. She is uncertain about having received a shingles vaccine.  Haley Stanley has a history of subclinical hypothyroidism but indicates it has not required medication. She mentions not eating prior to the appointment  as instructed and does not regularly check her blood pressure at home. She denies experiencing headaches or dizziness but notes occasional tingling in her feet after walking too much, which she sometimes alleviates with Epsom salt soaks.    All ROS negative with exception of what is listed above.   PHYSICAL EXAM Physical Exam Vitals and nursing note reviewed.  Constitutional:      Appearance: Normal appearance. She is obese.  HENT:     Head: Normocephalic.  Eyes:     Pupils: Pupils are equal, round, and reactive to light.  Neck:     Vascular: No carotid bruit.  Cardiovascular:     Rate and Rhythm: Normal rate and regular rhythm.     Pulses: Normal pulses.     Heart sounds: Murmur heard.  Pulmonary:     Effort: Pulmonary effort is normal.     Breath sounds: Normal breath sounds.  Musculoskeletal:        General: Tenderness and signs of injury present.     Cervical back: Normal range of motion. No tenderness.     Comments: Left shoulder pain with movement  Lymphadenopathy:     Cervical: No cervical adenopathy.  Skin:    General: Skin is warm and dry.     Capillary Refill: Capillary refill takes less than 2 seconds.  Neurological:     General: No focal deficit present.     Mental Status: She is alert and oriented to person, place, and time.  Psychiatric:        Mood and Affect: Mood normal.     PLAN Problem List Items Addressed This Visit     Hyperlipidemia associated with type 2 diabetes mellitus  She is currently on atorvastatin for management without any reported side effects.  Plan: - Continue atorvastatin. Refill provided.  - Labs pending today - Continue to follow low saturated fat, low carbohydrate diet.       Relevant Medications   atorvastatin (LIPITOR) 10 MG tablet   glucose blood test strip   metFORMIN (GLUCOPHAGE XR) 500 MG 24 hr tablet   valsartan (DIOVAN) 80 MG tablet   OneTouch Delica Lancets 33G MISC   Other Relevant Orders   CBC with  Differential/Platelet   Comprehensive metabolic panel   Hemoglobin A1c   Lipid panel   VITAMIN D 25 Hydroxy (Vit-D Deficiency, Fractures)   Subclinical hypothyroidism    Patient denies any symptoms at this time. No medications currently.  Plan: - monitor labs       Relevant Orders   CBC with Differential/Platelet   Comprehensive metabolic panel   Hemoglobin A1c   Lipid panel   VITAMIN D 25 Hydroxy (Vit-D Deficiency, Fractures)   Hypertension associated with diabetes    Haley Stanley's BP is slightly elevated above goal today, considering her pain and seeing a new provider today, I suspect this is contributing.  Plan: - Continue current management with valsartan. Refill provided  - Monitor BP at home and let me know if it is consistently staying above 130/80.       Relevant Medications   atorvastatin (LIPITOR) 10 MG tablet   metFORMIN (GLUCOPHAGE XR) 500 MG 24 hr tablet   valsartan (DIOVAN) 80 MG tablet   Type 2 diabetes mellitus with hyperglycemia, without long-term current use of insulin - Primary    She is monitoring her blood sugars at home and reports they are good. She is currently on metformin 500mg  for management. No new symptoms or concerns today. Plan: - continue metformin and monitoring of blood sugar at least once every day.  - Refills provided on medication and supplies - Labs pending.       Relevant Medications   atorvastatin (LIPITOR) 10 MG tablet   glucose blood test strip   metFORMIN (GLUCOPHAGE XR) 500 MG 24 hr tablet   valsartan (DIOVAN) 80 MG tablet   OneTouch Delica Lancets 33G MISC   Other Relevant Orders   CBC with Differential/Platelet   Comprehensive metabolic panel   Hemoglobin A1c   Lipid panel   VITAMIN D 25 Hydroxy (Vit-D Deficiency, Fractures)   Shoulder arthritis    Fracture of left shoulder last fall. Patient reports persistent pain despite trying Tylenol, Advil, pain pills, Voltaren, and BioFreeze. Cortisone injections were also administered  without relief. Plan: - Prescribe Celebrex (COX-2 inhibitor) to help with inflammation and pain. - Instruct patient to follow up if no improvement or if side effects occur.      Relevant Medications   celecoxib (CELEBREX) 100 MG capsule   Vitamin D deficiency   Relevant Medications   valsartan (DIOVAN) 80 MG tablet   Other Relevant Orders   CBC with Differential/Platelet   Comprehensive metabolic panel   Hemoglobin A1c   Lipid panel   VITAMIN D 25 Hydroxy (Vit-D Deficiency, Fractures)   Other Visit Diagnoses     Screening for colon cancer       Relevant Orders   Ambulatory referral to Gastroenterology   Need for shingles vaccine       Relevant Orders   Zoster Recombinant (Shingrix ) (Completed)       Return in about 6 months (around 05/31/2023) for Med management.   Haley Clamp, DNP, AGNP-c 11/29/2022  11:47 AM

## 2022-11-29 NOTE — Assessment & Plan Note (Signed)
Haley Stanley's BP is slightly elevated above goal today, considering her pain and seeing a new provider today, I suspect this is contributing.  Plan: - Continue current management with valsartan. Refill provided  - Monitor BP at home and let me know if it is consistently staying above 130/80.

## 2022-11-29 NOTE — Assessment & Plan Note (Signed)
Patient denies any symptoms at this time. No medications currently.  Plan: - monitor labs

## 2022-11-30 LAB — LIPID PANEL
Chol/HDL Ratio: 3.5 ratio (ref 0.0–4.4)
Cholesterol, Total: 211 mg/dL — ABNORMAL HIGH (ref 100–199)
HDL: 60 mg/dL (ref >39)
LDL Chol Calc (NIH): 121 mg/dL — ABNORMAL HIGH (ref 0–99)
Triglycerides: 172 mg/dL — ABNORMAL HIGH (ref 0–149)
VLDL Cholesterol Cal: 30 mg/dL (ref 5–40)

## 2022-11-30 LAB — CBC WITH DIFFERENTIAL/PLATELET
Basophils Absolute: 0 10*3/uL (ref 0.0–0.2)
Basos: 0 %
EOS (ABSOLUTE): 0.2 10*3/uL (ref 0.0–0.4)
Eos: 3 %
Hematocrit: 40.4 % (ref 34.0–46.6)
Hemoglobin: 13.4 g/dL (ref 11.1–15.9)
Immature Grans (Abs): 0 10*3/uL (ref 0.0–0.1)
Immature Granulocytes: 0 %
Lymphocytes Absolute: 2.6 10*3/uL (ref 0.7–3.1)
Lymphs: 33 %
MCH: 28.3 pg (ref 26.6–33.0)
MCHC: 33.2 g/dL (ref 31.5–35.7)
MCV: 85 fL (ref 79–97)
Monocytes Absolute: 0.4 10*3/uL (ref 0.1–0.9)
Monocytes: 5 %
Neutrophils Absolute: 4.8 10*3/uL (ref 1.4–7.0)
Neutrophils: 59 %
Platelets: 375 10*3/uL (ref 150–450)
RBC: 4.73 x10E6/uL (ref 3.77–5.28)
RDW: 13.1 % (ref 11.7–15.4)
WBC: 8 10*3/uL (ref 3.4–10.8)

## 2022-11-30 LAB — COMPREHENSIVE METABOLIC PANEL
ALT: 8 IU/L (ref 0–32)
AST: 14 IU/L (ref 0–40)
Albumin/Globulin Ratio: 1.1 — ABNORMAL LOW (ref 1.2–2.2)
Albumin: 3.9 g/dL (ref 3.9–4.9)
Alkaline Phosphatase: 116 IU/L (ref 44–121)
BUN/Creatinine Ratio: 16 (ref 12–28)
BUN: 13 mg/dL (ref 8–27)
Bilirubin Total: 0.4 mg/dL (ref 0.0–1.2)
CO2: 20 mmol/L (ref 20–29)
Calcium: 9.5 mg/dL (ref 8.7–10.3)
Chloride: 104 mmol/L (ref 96–106)
Creatinine, Ser: 0.81 mg/dL (ref 0.57–1.00)
Globulin, Total: 3.7 g/dL (ref 1.5–4.5)
Glucose: 121 mg/dL — ABNORMAL HIGH (ref 70–99)
Potassium: 4.3 mmol/L (ref 3.5–5.2)
Sodium: 140 mmol/L (ref 134–144)
Total Protein: 7.6 g/dL (ref 6.0–8.5)
eGFR: 80 mL/min/{1.73_m2} (ref >59)

## 2022-11-30 LAB — VITAMIN D 25 HYDROXY (VIT D DEFICIENCY, FRACTURES): Vit D, 25-Hydroxy: 14.4 ng/mL — ABNORMAL LOW (ref 30.0–100.0)

## 2022-11-30 LAB — HEMOGLOBIN A1C
Est. average glucose Bld gHb Est-mCnc: 154 mg/dL
Hgb A1c MFr Bld: 7 % — ABNORMAL HIGH (ref 4.8–5.6)

## 2022-12-01 ENCOUNTER — Other Ambulatory Visit: Payer: Self-pay | Admitting: Nurse Practitioner

## 2022-12-01 DIAGNOSIS — E559 Vitamin D deficiency, unspecified: Secondary | ICD-10-CM

## 2022-12-01 MED ORDER — VITAMIN D3 1.25 MG (50000 UT) PO TABS: 1.0000 | ORAL_TABLET | ORAL | 1 refills | Status: DC

## 2022-12-14 ENCOUNTER — Ambulatory Visit
Admission: RE | Admit: 2022-12-14 | Discharge: 2022-12-14 | Disposition: A | Discharge status: Home / Self Care | Payer: Medicare Other | Source: Ambulatory Visit | Attending: Nurse Practitioner | Admitting: Nurse Practitioner

## 2022-12-14 ENCOUNTER — Inpatient Hospital Stay: Admission: RE | Admit: 2022-12-14 | Payer: Medicare Other | Source: Ambulatory Visit

## 2022-12-14 DIAGNOSIS — N6489 Other specified disorders of breast: Secondary | ICD-10-CM | POA: Diagnosis not present

## 2022-12-15 ENCOUNTER — Telehealth: Payer: Self-pay | Admitting: Nurse Practitioner

## 2022-12-15 NOTE — Telephone Encounter (Signed)
Contacted Haley Stanley to schedule their annual wellness visit. Appointment made for 01/04/23.  [name] AWV direct phone # [phone]   Due to schedule change   r/s 5/10 awv appt to 5/14 Pt aware

## 2022-12-23 ENCOUNTER — Other Ambulatory Visit: Payer: Self-pay

## 2022-12-23 ENCOUNTER — Telehealth: Payer: Self-pay | Admitting: Nurse Practitioner

## 2022-12-23 DIAGNOSIS — E1165 Type 2 diabetes mellitus with hyperglycemia: Secondary | ICD-10-CM

## 2022-12-23 DIAGNOSIS — E1169 Type 2 diabetes mellitus with other specified complication: Secondary | ICD-10-CM

## 2022-12-23 MED ORDER — GLUCOSE BLOOD VI STRP: ORAL_STRIP | 6 refills | Status: DC

## 2022-12-23 NOTE — Telephone Encounter (Signed)
Pt called and states that the test strips that was called in was the wrong ones  She needs the one touch ultra sent in  Please send to the   [id number] - [place]),  - [address]

## 2023-01-04 ENCOUNTER — Ambulatory Visit (INDEPENDENT_AMBULATORY_CARE_PROVIDER_SITE_OTHER): Payer: Medicare Other

## 2023-01-04 VITALS — Ht 61.0 in | Wt 305.0 lb

## 2023-01-04 DIAGNOSIS — Z0000 Encounter for general adult medical examination without abnormal findings: Secondary | ICD-10-CM

## 2023-01-04 NOTE — Addendum Note (Signed)
Addended by: Elisha Ponder E on: 01/04/2023 10:02 AM   Modules accepted: Level of Service

## 2023-01-04 NOTE — Progress Notes (Signed)
I connected with  Haley Stanley on 01/04/23 by a video and audio enabled telemedicine application and verified that I am speaking with the correct person using two identifiers.  Patient Location: Home  Provider Location: Office/Clinic  I discussed the limitations of evaluation and management by telemedicine. The patient expressed understanding and agreed to proceed.  Subjective:   Haley Stanley is a 67 y.o. female who presents for Medicare Annual (Subsequent) preventive examination.  Patient Medicare AWV questionnaire was completed by the patient on 12/31/2022; I have confirmed that all information answered by patient is correct and no changes since this date.     Review of Systems     Cardiac Risk Factors include: advanced age (>61men, >54 women);diabetes mellitus;dyslipidemia;obesity (BMI >30kg/m2)     Objective:    Today's Vitals   01/04/23 0939  Weight: (!) 305 lb (138.3 kg)  Height: 5\' 1"  (1.549 m)  PainSc: 7    Body mass index is 57.63 kg/m.     01/04/2023    9:44 AM 12/25/2021    9:46 AM 10/29/2015   11:54 PM  Advanced Directives  Does Patient Have a Medical Advance Directive? No No No    Current Medications (verified) Outpatient Encounter Medications as of 01/04/2023  Medication Sig   acetaminophen (TYLENOL) 500 MG tablet Take 1,000 mg by mouth every 6 (six) hours as needed.   atorvastatin (LIPITOR) 10 MG tablet Take 1 tablet (10 mg total) by mouth daily.   Blood Glucose Monitoring Suppl (ONETOUCH VERIO) w/Device KIT Check FSBS daily   celecoxib (CELEBREX) 100 MG capsule Take 1 capsule (100 mg total) by mouth 2 (two) times daily.   Cholecalciferol (VITAMIN D3) 1.25 MG (50000 UT) TABS Take 1 tablet by mouth once a week.   glucose blood test strip For testing blood sugar up to 3 times a day for Diabetes.   metFORMIN (GLUCOPHAGE XR) 500 MG 24 hr tablet Take 1 tablet (500 mg total) by mouth daily with breakfast.   Omega-3 Fatty Acids (FISH OIL) 1200 MG CAPS Take 2  capsules by mouth daily.   OneTouch Delica Lancets 33G MISC 100 applicators by Does not apply route daily.   valsartan (DIOVAN) 80 MG tablet Take 1 tablet (80 mg total) by mouth daily.   No facility-administered encounter medications on file as of 01/04/2023.    Allergies (verified) Patient has no known allergies.   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    Subclinical hypothyroidism 04/29/2021   Trichomonas infection 05/27/2021   Past Surgical History:  Procedure Laterality Date   CATARACT EXTRACTION Left 10/23/2021   gallstone removal     KNEE SURGERY     Family History  Problem Relation Age of Onset   Heart failure Mother    Breast cancer Neg Hx    Social History   Socioeconomic History   Marital status: Single    Spouse name: Not on file   Number of children: Not on file   Years of education: Not on file   Highest education level: Not on file  Occupational History  Not on file  Tobacco Use   Smoking status: Never   Smokeless tobacco: Never  Vaping Use   Vaping Use: Never used  Substance and Sexual Activity   Alcohol use: No   Drug use: No   Sexual activity: Not on file  Other Topics Concern   Not on file  Social History Narrative   Not on file   Social Determinants of Health   Financial Resource Strain: Low Risk  (12/31/2022)   Overall Financial Resource Strain (CARDIA)    Difficulty of Paying Living Expenses: Not hard at all  Food Insecurity: No Food Insecurity (12/31/2022)   Hunger Vital Sign    Worried About Running Out of Food in  the Last Year: Never true    Ran Out of Food in the Last Year: Never true  Transportation Needs: No Transportation Needs (12/31/2022)   PRAPARE - Administrator, Civil Service (Medical): No    Lack of Transportation (Non-Medical): No  Physical Activity: Inactive (12/31/2022)   Exercise Vital Sign    Days of Exercise per Week: 0 days    Minutes of Exercise per Session: 0 min  Stress: No Stress Concern Present (12/31/2022)   [employer] of Occupational Health - Occupational Stress Questionnaire    Feeling of Stress : Not at all  Social Connections: Unknown (12/31/2022)   Social Connection and Isolation Panel [NHANES]    Frequency of Communication with Friends and Family: More than three times a week    Frequency of Social Gatherings with Friends and Family: Once a week    Attends Religious Services: Not on Marketing executive or Organizations: No    Attends Banker Meetings: Never    Marital Status: Never married    Tobacco Counseling Counseling given: Not Answered   Clinical Intake:  Pre-visit preparation completed: Yes  Pain : 0-10 Pain Score: 7  Pain Type: Chronic pain Pain Location: Shoulder Pain Orientation: Left Pain Descriptors / Indicators: Aching Pain Onset: More than a month ago Pain Frequency: Constant     Nutritional Status: BMI > 30  Obese Nutritional Risks: None Diabetes: Yes  How often do you need to have someone help you when you read instructions, pamphlets, or other written materials from your doctor or pharmacy?: 1 - Never  Diabetic? Yes Nutrition Risk Assessment:  Has the patient had any N/V/D within the last 2 months?  No  Does the patient have any non-healing wounds?  No  Has the patient had any unintentional weight loss or weight gain?  No   Diabetes:  Is the patient diabetic?  Yes  If diabetic, was a CBG obtained today?  No  Did the patient bring in their glucometer from home?  No  How often do you  monitor your CBG's? daily.   Financial Strains and Diabetes Management:  Are you having any financial strains with the device, your supplies or your medication? No .  Does the patient want to be seen by Chronic Care Management for management of their diabetes?  No  Would the patient like to be referred to a Nutritionist or for Diabetic Management?  No   Diabetic Exams:  Diabetic Eye Exam: Overdue for diabetic eye exam. Pt has been advised about the importance in completing this exam. Patient advised to call and schedule an eye exam. Diabetic Foot Exam: Overdue, Pt has been advised about the importance in completing this exam. Pt is scheduled for diabetic foot exam  on next appointment.   Interpreter Needed?: No  Information entered by :: NAllen LPN   Activities of Daily Living    12/31/2022   11:56 AM  In your present state of health, do you have any difficulty performing the following activities:  Hearing? 0  Vision? 0  Difficulty concentrating or making decisions? 0  Walking or climbing stairs? 1  Dressing or bathing? 0  Doing errands, shopping? 0  Preparing Food and eating ? N  Using the Toilet? N  In the past six months, have you accidently leaked urine? Y  Comment with laugh or sneeze  Do you have problems with loss of bowel control? N  Managing your Medications? N  Managing your Finances? N  Housekeeping or managing your Housekeeping? N    Patient Care Team: Early, Sung Amabile, NP as PCP - [facility] (Nurse Practition[facility], [facility] (Ophthalmology)  Indicate any recent Medical Services you may have received from other than [facility] providers in the past year (date may be approximate).     Assessment:   This is a routine wellness examination for Haley Stanley.  Hearing/Vision screen Vision Screening - Comments:: Regular eye exams, [facility]  Dietary issues and exercise activities discussed: Current Exercise Habits: The patient does not participate in regular  exercise at present   Goals Addressed             This Visit's Progress    Patient Stated       01/04/2023, wants to lose weight       Depression Screen    01/04/2023    9:46 AM 11/29/2022   11:44 AM 01/01/2022   10:23 AM 12/25/2021    9:47 AM 04/28/2021   10:31 AM  PHQ 2/9 Scores  PHQ - 2 Score 0 0 0 0 0  PHQ- 9 Score 6  0 0     Fall Risk    01/04/2023    9:45 AM 12/31/2022   11:56 AM 11/29/2022   11:44 AM 01/01/2022   10:23 AM 12/25/2021    9:46 AM  Fall Risk   Falls in the past year? 1 1 1  0 0  Comment tangled up in feet      Number falls in past yr: 0 0 0 0 0  Injury with Fall? 1 1 1  0 0  Comment fractured shoulder      Risk for fall due to : Impaired mobility;Impaired balance/gait;Medication side effect  Other (Comment) No Fall Risks Medication side effect  Follow up Falls prevention discussed;Education provided;Falls evaluation completed  Falls evaluation completed Falls evaluation completed Falls evaluation completed;Education provided;Falls prevention discussed    FALL RISK PREVENTION PERTAINING TO THE HOME:  Any stairs in or around the home? No  If so, are there any without handrails? N/a Home free of loose throw rugs in walkways, pet beds, electrical cords, etc? Yes  Adequate lighting in your home to reduce risk of falls? Yes   ASSISTIVE DEVICES UTILIZED TO PREVENT FALLS:  Life alert? No  Use of a cane, walker or w/c? Yes  Grab bars in the bathroom? Yes  Shower chair or bench in shower? No  Elevated toilet seat or a handicapped toilet? No   TIMED UP AND GO:  Was the test performed? No .      Cognitive Function:        01/04/2023    9:48 AM 12/25/2021    9:48 AM  6CIT Screen  What Year? 0 points 0 points  What month? 0 points 0 points  What time? 0 points 0 points  Count back from 20 0 points 0 points  Months in reverse 0 points 0 points  Repeat phrase 0 points 0 points  Total Score 0 points 0 points    Immunizations Immunization History   Administered Date(s) Administered   Fluad Quad(high Dose 65+) 04/28/2021   PFIZER Comirnaty(Gray Top)Covid-19 Tri-Sucrose Vaccine 09/28/2019, 10/19/2019, 08/20/2020, 04/09/2021   Pfizer Covid-19 Vaccine Bivalent Booster 87yrs & up 08/05/2021   Pneumococcal Polysaccharide-23 05/05/2021   Tdap 10/30/2015   Zoster Recombinat (Shingrix) 01/01/2022, 11/29/2022    TDAP status: Up to date  Flu Vaccine status: Up to date  Pneumococcal vaccine status: Up to date  Covid-19 vaccine status: Completed vaccines  Qualifies for Shingles Vaccine? Yes   Zostavax completed Yes   Shingrix Completed?: Yes  Screening Tests Health Maintenance  Topic Date Due   COLONOSCOPY (Pts 45-85yrs Insurance coverage will need to be confirmed)  Never done   COVID-19 Vaccine (6 - 2023-24 season) 04/23/2022   Pneumonia Vaccine 73+ Years old (2 of 2 - PCV) 05/05/2022   Diabetic kidney evaluation - Urine ACR  08/05/2022   FOOT EXAM  08/05/2022   OPHTHALMOLOGY EXAM  11/03/2022   Medicare Annual Wellness (AWV)  12/26/2022   INFLUENZA VACCINE  03/24/2023   HEMOGLOBIN A1C  05/31/2023   Diabetic kidney evaluation - eGFR measurement  11/29/2023   MAMMOGRAM  11/10/2024   DTaP/Tdap/Td (2 - Td or Tdap) 10/29/2025   DEXA SCAN  Completed   Hepatitis C Screening  Completed   Zoster Vaccines- Shingrix  Completed   HPV VACCINES  Aged Out    Health Maintenance  Health Maintenance Due  Topic Date Due   COLONOSCOPY (Pts 45-15yrs Insurance coverage will need to be confirmed)  Never done   COVID-19 Vaccine (6 - 2023-24 season) 04/23/2022   Pneumonia Vaccine 67+ Years old (2 of 2 - PCV) 05/05/2022   Diabetic kidney evaluation - Urine ACR  08/05/2022   FOOT EXAM  08/05/2022   OPHTHALMOLOGY EXAM  11/03/2022   Medicare Annual Wellness (AWV)  12/26/2022    Colorectal cancer screening: consult on 02/17/2023  Mammogram status: Completed 11/11/2022. Repeat every year  Bone Density status: Completed 03/12/2022  Lung Cancer  Screening: (Low Dose CT Chest recommended if Age 65-80 years, 30 pack-year currently smoking OR have quit w/in 15years.) does not qualify.   Lung Cancer Screening Referral: no  Additional Screening:  Hepatitis C Screening: does qualify; Completed 08/19/2021  Vision Screening: Recommended annual ophthalmology exams for early detection of glaucoma and other disorders of the eye. Is the patient up to date with their annual eye exam?  Yes  Who is the provider or what is the name of the office in which the patient attends annual eye exams? [facility] If pt is not established with a provider, would they like to be referred to a provider to establish care? No .   Dental Screening: Recommended annual dental exams for proper oral hygiene  Community Resource Referral / Chronic Care Management: CRR required this visit?  No   CCM required this visit?  No      Plan:     I have personally reviewed and noted the following in the patient's chart:   Medical and social history Use of alcohol, tobacco or illicit drugs  Current medications and supplements including opioid prescriptions. Patient is not currently taking opioid prescriptions. Functional ability and status Nutritional status Physical activity Advanced  directives List of other physicians Hospitalizations, surgeries, and ER visits in previous 12 months Vitals Screenings to include cognitive, depression, and falls Referrals and appointments  In addition, I have reviewed and discussed with patient certain preventive protocols, quality metrics, and best practice recommendations. A written personalized care plan for preventive services as well as general preventive health recommendations were provided to patient.     Barb Merino, LPN   04/20/5620   Nurse Notes: none  Due to this being a virtual visit, the after visit summary with patients personalized plan was offered to patient via mail or my-chart. Patient would like to  access on my-chart

## 2023-01-04 NOTE — Patient Instructions (Signed)
Ms. Brandner , Thank you for taking time to come for your Medicare Wellness Visit. I appreciate your ongoing commitment to your health goals. Please review the following plan we discussed and let me know if I can assist you in the future.   These are the goals we discussed:  Goals      Patient Stated     12/25/2021, continue to lose weight     Patient Stated     01/04/2023, wants to lose weight        This is a list of the screening recommended for you and due dates:  Health Maintenance  Topic Date Due   Colon Cancer Screening  Never done   COVID-19 Vaccine (6 - 2023-24 season) 04/23/2022   Pneumonia Vaccine (2 of 2 - PCV) 05/05/2022   Yearly kidney health urinalysis for diabetes  08/05/2022   Complete foot exam   08/05/2022   Eye exam for diabetics  11/03/2022   Flu Shot  03/24/2023   Hemoglobin A1C  05/31/2023   Yearly kidney function blood test for diabetes  11/29/2023   Medicare Annual Wellness Visit  01/04/2024   Mammogram  11/10/2024   DTaP/Tdap/Td vaccine (2 - Td or Tdap) 10/29/2025   DEXA scan (bone density measurement)  Completed   Hepatitis C Screening: USPSTF Recommendation to screen - Ages 73-79 yo.  Completed   Zoster (Shingles) Vaccine  Completed   HPV Vaccine  Aged Out    Advanced directives: Advance directive discussed with you today.   Conditions/risks identified: none  Next appointment: Follow up in one year for your annual wellness visit    Preventive Care 65 Years and Older, Female Preventive care refers to lifestyle choices and visits with your health care provider that can promote health and wellness. What does preventive care include? A yearly physical exam. This is also called an annual well check. Dental exams once or twice a year. Routine eye exams. Ask your health care provider how often you should have your eyes checked. Personal lifestyle choices, including: Daily care of your teeth and gums. Regular physical activity. Eating a healthy  diet. Avoiding tobacco and drug use. Limiting alcohol use. Practicing safe sex. Taking low-dose aspirin every day. Taking vitamin and mineral supplements as recommended by your health care provider. What happens during an annual well check? The services and screenings done by your health care provider during your annual well check will depend on your age, overall health, lifestyle risk factors, and family history of disease. Counseling  Your health care provider may ask you questions about your: Alcohol use. Tobacco use. Drug use. Emotional well-being. Home and relationship well-being. Sexual activity. Eating habits. History of falls. Memory and ability to understand (cognition). Work and work Astronomer. Reproductive health. Screening  You may have the following tests or measurements: Height, weight, and BMI. Blood pressure. Lipid and cholesterol levels. These may be checked every 5 years, or more frequently if you are over 64 years old. Skin check. Lung cancer screening. You may have this screening every year starting at age 77 if you have a 30-pack-year history of smoking and currently smoke or have quit within the past 15 years. Fecal occult blood test (FOBT) of the stool. You may have this test every year starting at age 4. Flexible sigmoidoscopy or colonoscopy. You may have a sigmoidoscopy every 5 years or a colonoscopy every 10 years starting at age 22. Hepatitis C blood test. Hepatitis B blood test. Sexually transmitted disease (STD) testing. Diabetes  screening. This is done by checking your blood sugar (glucose) after you have not eaten for a while (fasting). You may have this done every 1-3 years. Bone density scan. This is done to screen for osteoporosis. You may have this done starting at age 43. Mammogram. This may be done every 1-2 years. Talk to your health care provider about how often you should have regular mammograms. Talk with your health care provider about  your test results, treatment options, and if necessary, the need for more tests. Vaccines  Your health care provider may recommend certain vaccines, such as: Influenza vaccine. This is recommended every year. Tetanus, diphtheria, and acellular pertussis (Tdap, Td) vaccine. You may need a Td booster every 10 years. Zoster vaccine. You may need this after age 13. Pneumococcal 13-valent conjugate (PCV13) vaccine. One dose is recommended after age 18. Pneumococcal polysaccharide (PPSV23) vaccine. One dose is recommended after age 61. Talk to your health care provider about which screenings and vaccines you need and how often you need them. This information is not intended to replace advice given to you by your health care provider. Make sure you discuss any questions you have with your health care provider. Document Released: 09/05/2015 Document Revised: 04/28/2016 Document Reviewed: 06/10/2015 Elsevier Interactive Patient Education  2017 Major Prevention in the Home Falls can cause injuries. They can happen to people of all ages. There are many things you can do to make your home safe and to help prevent falls. What can I do on the outside of my home? Regularly fix the edges of walkways and driveways and fix any cracks. Remove anything that might make you trip as you walk through a door, such as a raised step or threshold. Trim any bushes or trees on the path to your home. Use bright outdoor lighting. Clear any walking paths of anything that might make someone trip, such as rocks or tools. Regularly check to see if handrails are loose or broken. Make sure that both sides of any steps have handrails. Any raised decks and porches should have guardrails on the edges. Have any leaves, snow, or ice cleared regularly. Use sand or salt on walking paths during winter. Clean up any spills in your garage right away. This includes oil or grease spills. What can I do in the bathroom? Use  night lights. Install grab bars by the toilet and in the tub and shower. Do not use towel bars as grab bars. Use non-skid mats or decals in the tub or shower. If you need to sit down in the shower, use a plastic, non-slip stool. Keep the floor dry. Clean up any water that spills on the floor as soon as it happens. Remove soap buildup in the tub or shower regularly. Attach bath mats securely with double-sided non-slip rug tape. Do not have throw rugs and other things on the floor that can make you trip. What can I do in the bedroom? Use night lights. Make sure that you have a light by your bed that is easy to reach. Do not use any sheets or blankets that are too big for your bed. They should not hang down onto the floor. Have a firm chair that has side arms. You can use this for support while you get dressed. Do not have throw rugs and other things on the floor that can make you trip. What can I do in the kitchen? Clean up any spills right away. Avoid walking on wet floors. Keep  items that you use a lot in easy-to-reach places. If you need to reach something above you, use a strong step stool that has a grab bar. Keep electrical cords out of the way. Do not use floor polish or wax that makes floors slippery. If you must use wax, use non-skid floor wax. Do not have throw rugs and other things on the floor that can make you trip. What can I do with my stairs? Do not leave any items on the stairs. Make sure that there are handrails on both sides of the stairs and use them. Fix handrails that are broken or loose. Make sure that handrails are as long as the stairways. Check any carpeting to make sure that it is firmly attached to the stairs. Fix any carpet that is loose or worn. Avoid having throw rugs at the top or bottom of the stairs. If you do have throw rugs, attach them to the floor with carpet tape. Make sure that you have a light switch at the top of the stairs and the bottom of the  stairs. If you do not have them, ask someone to add them for you. What else can I do to help prevent falls? Wear shoes that: Do not have high heels. Have rubber bottoms. Are comfortable and fit you well. Are closed at the toe. Do not wear sandals. If you use a stepladder: Make sure that it is fully opened. Do not climb a closed stepladder. Make sure that both sides of the stepladder are locked into place. Ask someone to hold it for you, if possible. Clearly mark and make sure that you can see: Any grab bars or handrails. First and last steps. Where the edge of each step is. Use tools that help you move around (mobility aids) if they are needed. These include: Canes. Walkers. Scooters. Crutches. Turn on the lights when you go into a dark area. Replace any light bulbs as soon as they burn out. Set up your furniture so you have a clear path. Avoid moving your furniture around. If any of your floors are uneven, fix them. If there are any pets around you, be aware of where they are. Review your medicines with your doctor. Some medicines can make you feel dizzy. This can increase your chance of falling. Ask your doctor what other things that you can do to help prevent falls. This information is not intended to replace advice given to you by your health care provider. Make sure you discuss any questions you have with your health care provider. Document Released: 06/05/2009 Document Revised: 01/15/2016 Document Reviewed: 09/13/2014 Elsevier Interactive Patient Education  2017 [employer].

## 2023-02-17 ENCOUNTER — Ambulatory Visit: Payer: Medicare Other | Admitting: Internal Medicine

## 2023-03-25 ENCOUNTER — Other Ambulatory Visit: Payer: Self-pay | Admitting: Nurse Practitioner

## 2023-03-25 DIAGNOSIS — M19019 Primary osteoarthritis, unspecified shoulder: Secondary | ICD-10-CM

## 2023-03-25 NOTE — Telephone Encounter (Signed)
Last apt 11/29/22

## 2023-05-11 ENCOUNTER — Ambulatory Visit: Payer: Medicare Other | Admitting: Internal Medicine

## 2023-06-02 ENCOUNTER — Encounter: Payer: Medicare Other | Admitting: Nurse Practitioner

## 2023-06-24 DIAGNOSIS — E119 Type 2 diabetes mellitus without complications: Secondary | ICD-10-CM | POA: Diagnosis not present

## 2023-07-12 ENCOUNTER — Encounter: Payer: Medicare Other | Admitting: Nurse Practitioner

## 2023-07-24 DIAGNOSIS — E119 Type 2 diabetes mellitus without complications: Secondary | ICD-10-CM | POA: Diagnosis not present

## 2023-08-19 IMAGING — MR MR THORACIC SPINE WO/W CM
9 of 10 series · 35 of 48 positions shown · IV contrast (10 GADAVIST)
Comparison: Two-view chest x-ray 05/04/2021

CLINICAL DATA: Abnormal chest x-ray. Possible bone lesions.

EXAM:
MRI THORACIC WITHOUT AND WITH CONTRAST
TECHNIQUE: Multiplanar and multiecho pulse sequences of the thoracic spine were
obtained without and with intravenous contrast.
CONTRAST:  10mL GADAVIST GADOBUTROL 1 MMOL/ML IV SOLN

[Series 16: T1 · sagittal · 4.0mm · 1.72mm/px · 1 of 5 slices shown (1 of 3)]
[im 1/5]
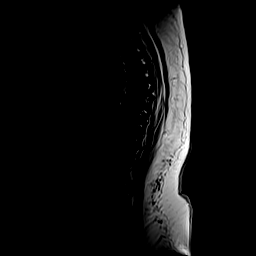

[Series 17: STIR · sagittal · 3.0mm · 1.00mm/px · 3 of 15 slices shown]
[im 1/15]
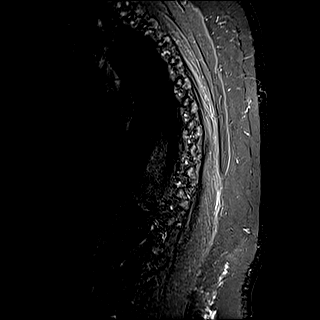
[im 8/15]
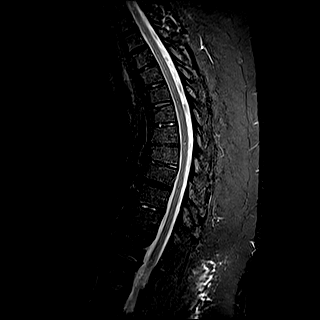
[im 15/15]
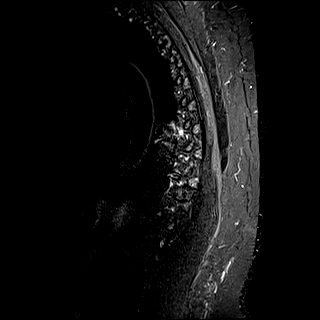

[Series 18: T1 · sagittal · 3.0mm · 1.00mm/px · 3 of 15 slices shown (2 of 3)]
[im 1/15]
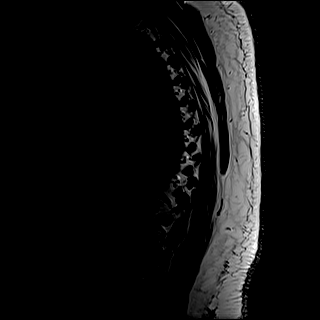
[im 8/15]
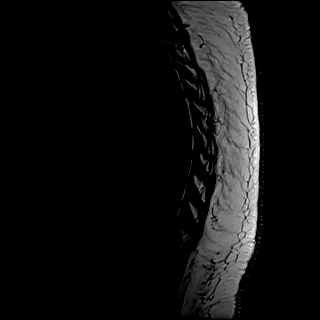
[im 15/15]
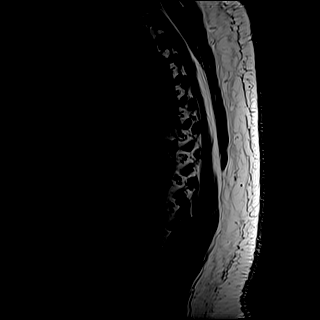

[Series 19: T2 · axial · 4.0mm · 0.78mm/px · z∈[-246,-58]mm · 7 of 39 slices shown (1 of 2)]
[im 1/39]
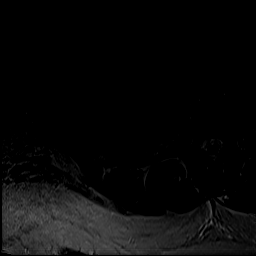
[im 7/39]
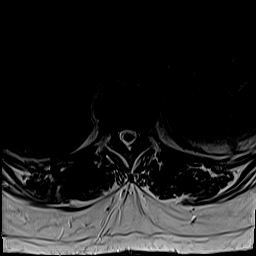
[im 13/39]
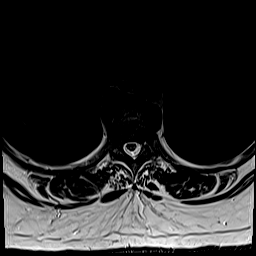
[im 20/39]
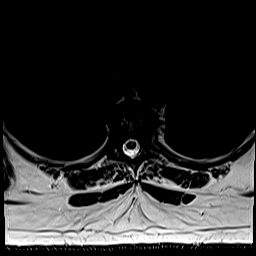
[im 26/39]
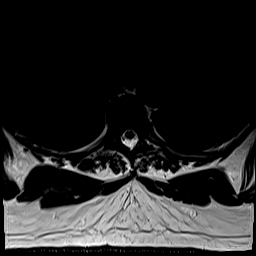
[im 32/39]
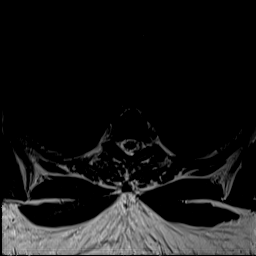
[im 39/39]
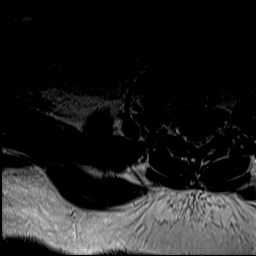

[Series 21: T2 · axial · 4.0mm · 0.78mm/px · z∈[-246,-62]mm · 7 of 39 slices shown (2 of 2)]
[im 1/39]
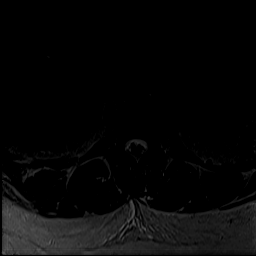
[im 7/39]
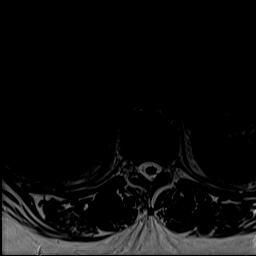
[im 13/39]
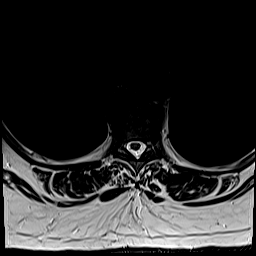
[im 20/39]
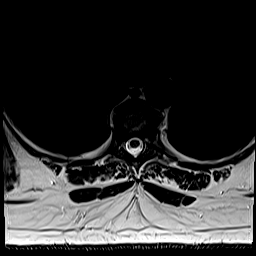
[im 26/39]
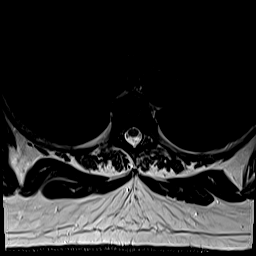
[im 32/39]
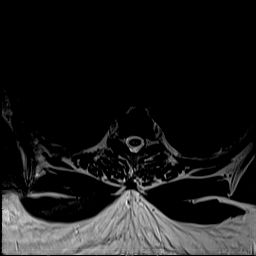
[im 39/39]
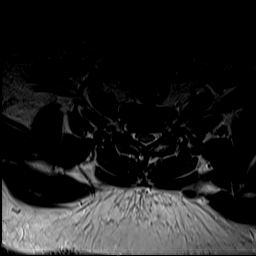

[Series 22: T1 · axial · 4.0mm · 0.39mm/px · z∈[-246,-62]mm · 7 of 39 slices shown (3 of 3)]
[im 1/39]
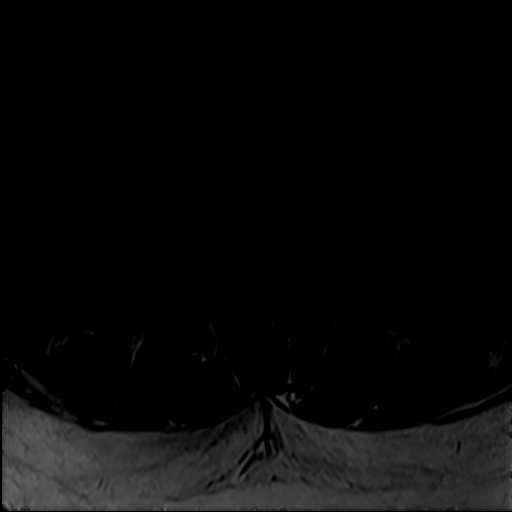
[im 7/39]
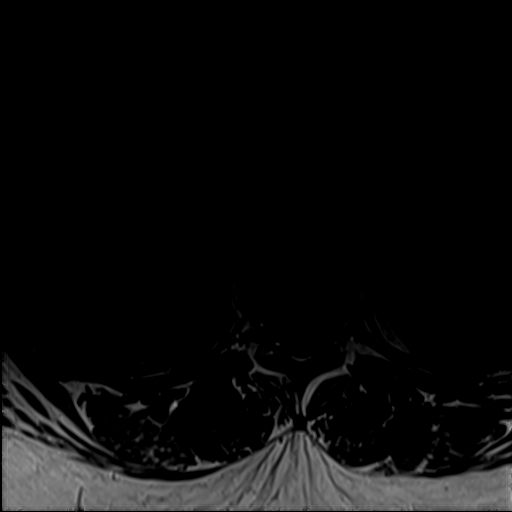
[im 13/39]
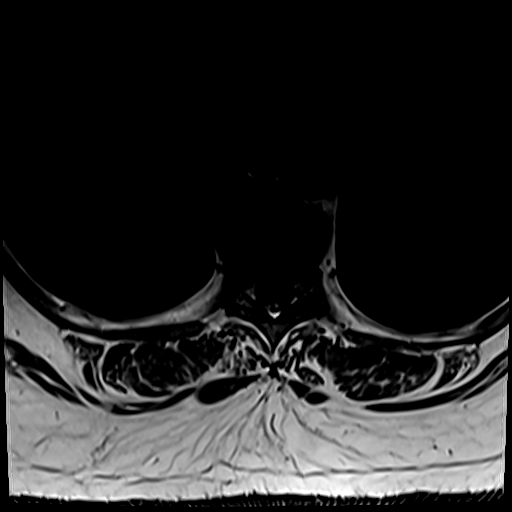
[im 20/39]
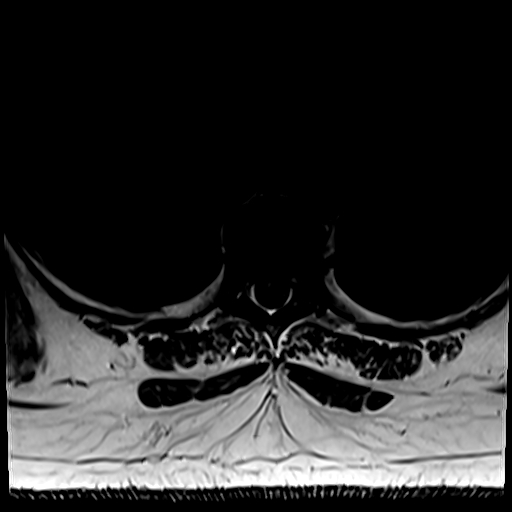
[im 26/39]
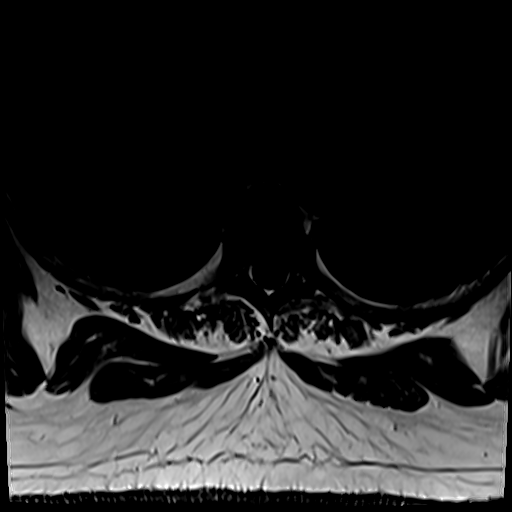
[im 32/39]
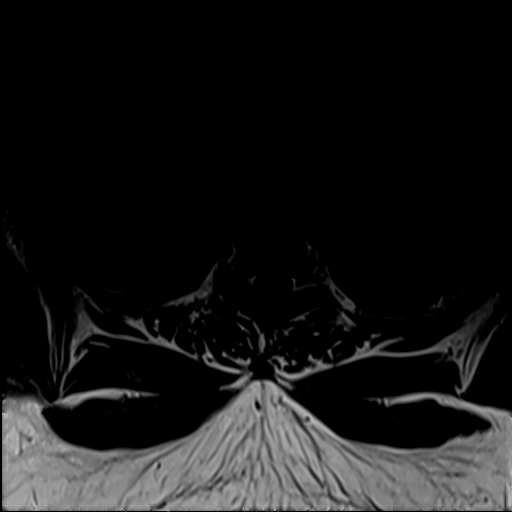
[im 39/39]
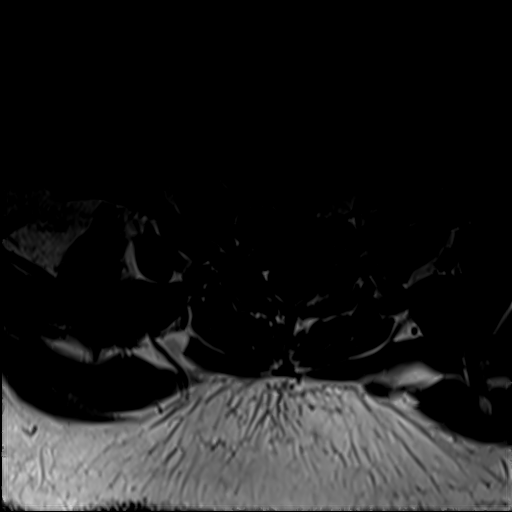

[Series 23: T2 post-contrast · sagittal · 3.0mm · 0.83mm/px · 3 of 15 slices shown]
[im 1/15]
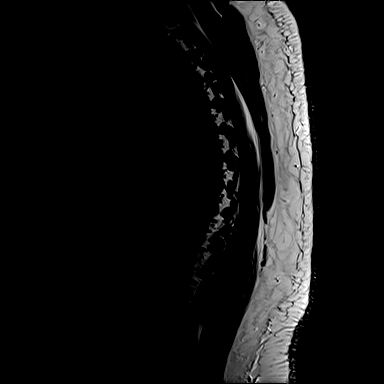
[im 8/15]
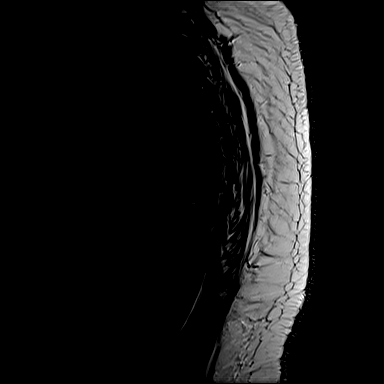
[im 15/15]
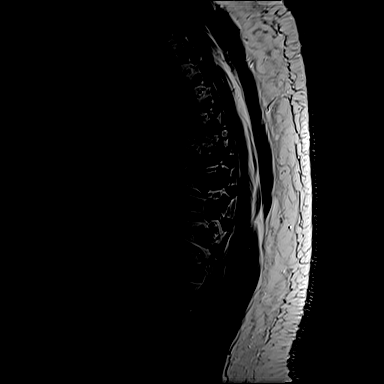

[Series 24: T1 fat-sat post-contrast · sagittal · 3.0mm · 1.00mm/px · 3 of 15 slices shown]
[im 1/15]
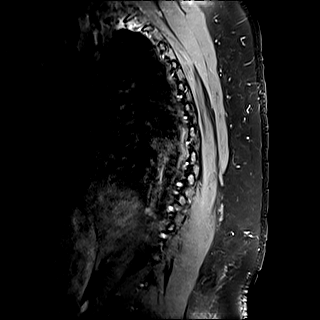
[im 8/15]
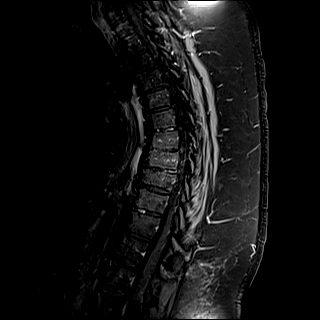
[im 15/15]
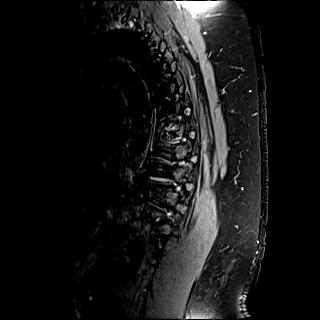

[Series 25: T1 post-contrast · axial · 4.0mm · 0.39mm/px · 1 of 39 slices shown]
[im 1/39]
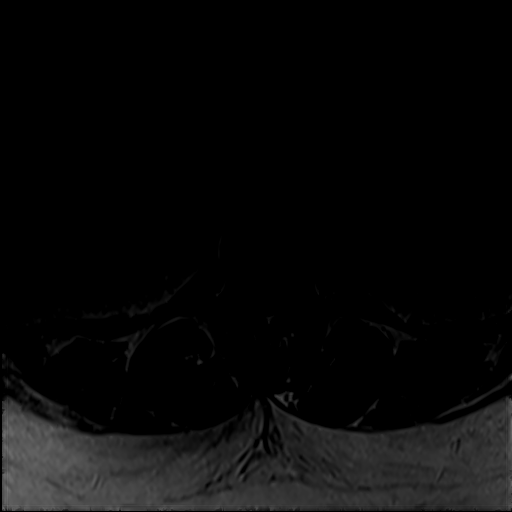

[35 of 48 positions shown; findings below may reference images not displayed]

FINDINGS: Alignment:  No significant listhesis is present.

Vertebrae: Chronic loss of height present at T9. Mild anterior loss
of height present at T8. No acute fractures. No focal osseous
lesions or abnormal enhancement is present. No evidence for
metastatic disease or myeloma.

Cord:  Normal signal and morphology.

Paraspinal and other soft tissues: Limited imaging the abdomen is
unremarkable. There is no significant adenopathy. No solid organ
lesions are present. Visualized lung fields and mediastinum are
within normal limits. Paraspinous musculature is unremarkable.

Disc levels:

T1-2: Negative.

T2-3: Negative.

T3-4: Facet hypertrophy contributes to mild right foraminal
narrowing.

T4-5: Facet hypertrophy contributes to mild right foraminal
narrowing. The central canal is patent.

T5-6: Negative.

T6-7: Negative.

T7-8: Negative.

T8-9: Facet hypertrophy results in mild foraminal narrowing
bilaterally. This is worse on the right.

T9-10: Negative.

T10-11: Mild left foraminal narrowing is due to facet hypertrophy.

T11-12: Negative.

T12-L1: Negative.
IMPRESSION: 1. No focal or acute marrow signal changes to suggest osseous
lesions. No evidence for metastatic disease or myeloma. Findings on
chest x-ray were artifactual.
2. Chronic compression deformities at T8 and T9.
3. Facet hypertrophy contributes to mild right foraminal narrowing
at T3-4, T4-5, and T8-9.
4. Mild left foraminal narrowing at T10-11.

## 2023-08-27 IMAGING — US US BREAST*R* LIMITED INC AXILLA
1 series · 5 of 5 positions shown · non-contrast
Comparison: Previous exam(s).

CLINICAL DATA: 65-year-old female for further evaluation of
possible RIGHT breast asymmetry on new baseline screening mammogram.

EXAM:
DIGITAL DIAGNOSTIC UNILATERAL RIGHT MAMMOGRAM WITH TOMOSYNTHESIS AND
CAD; ULTRASOUND RIGHT BREAST LIMITED
TECHNIQUE: Right digital diagnostic mammography and breast tomosynthesis was
performed. The images were evaluated with computer-aided detection.;
Targeted ultrasound examination of the right breast was performed

[Series 1: us breast*right* limited inc axilla · 0.07mm/px · 5 of 5 slices shown]
[im 1/5]
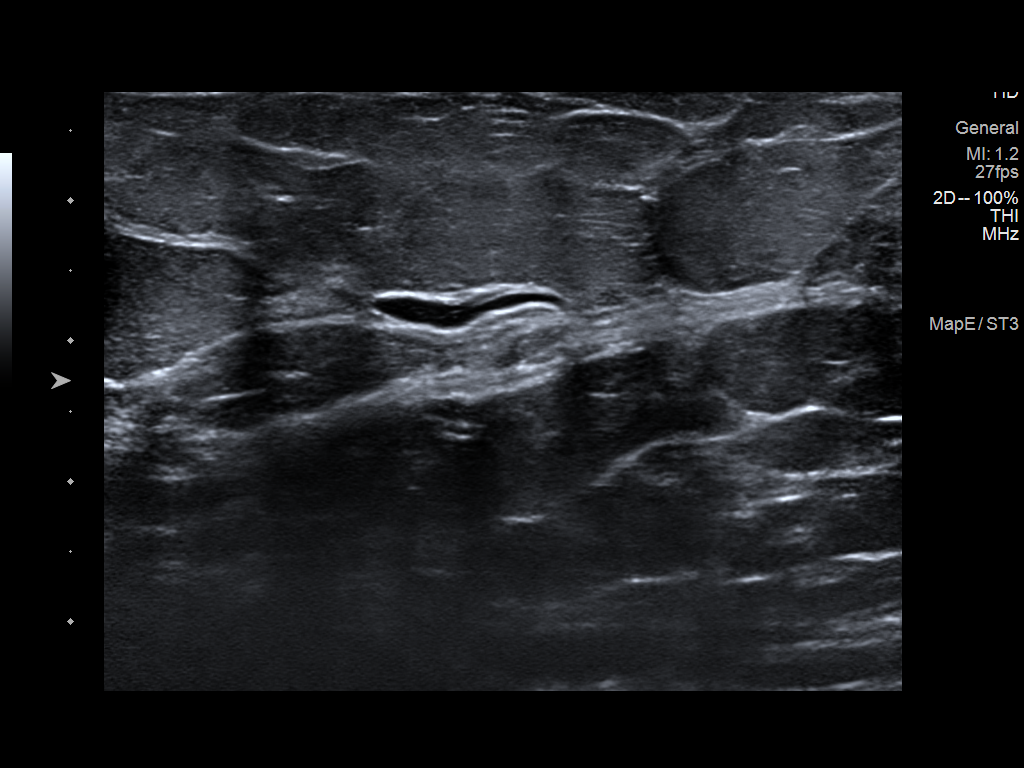
[im 2/5]
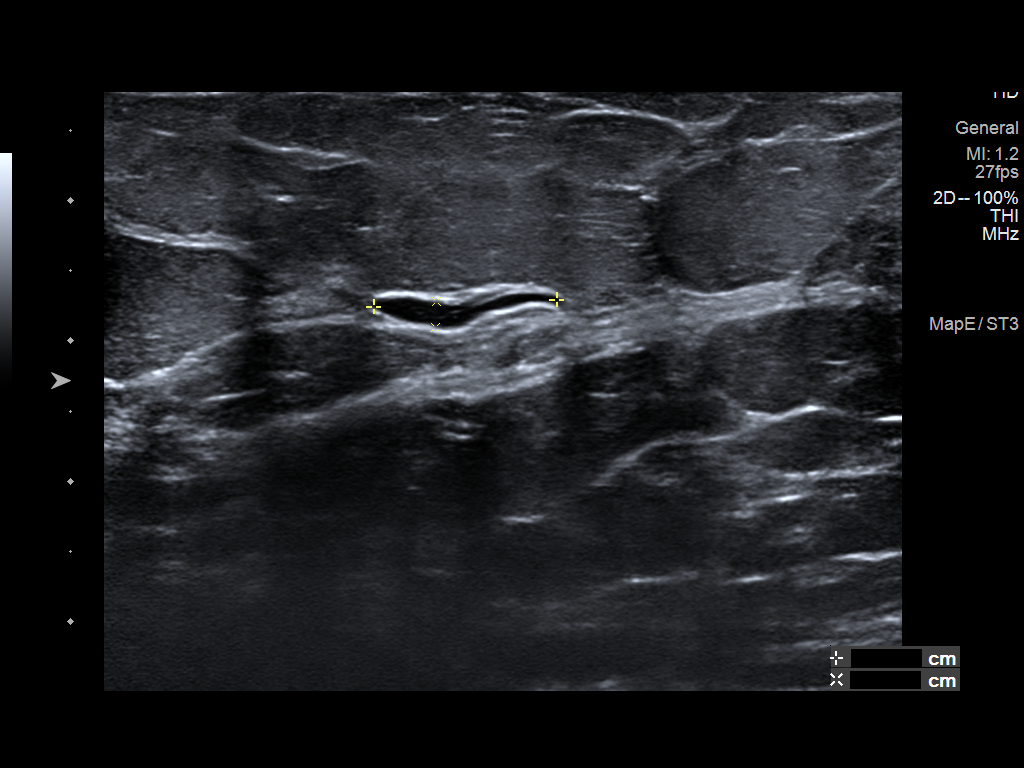
[im 3/5]
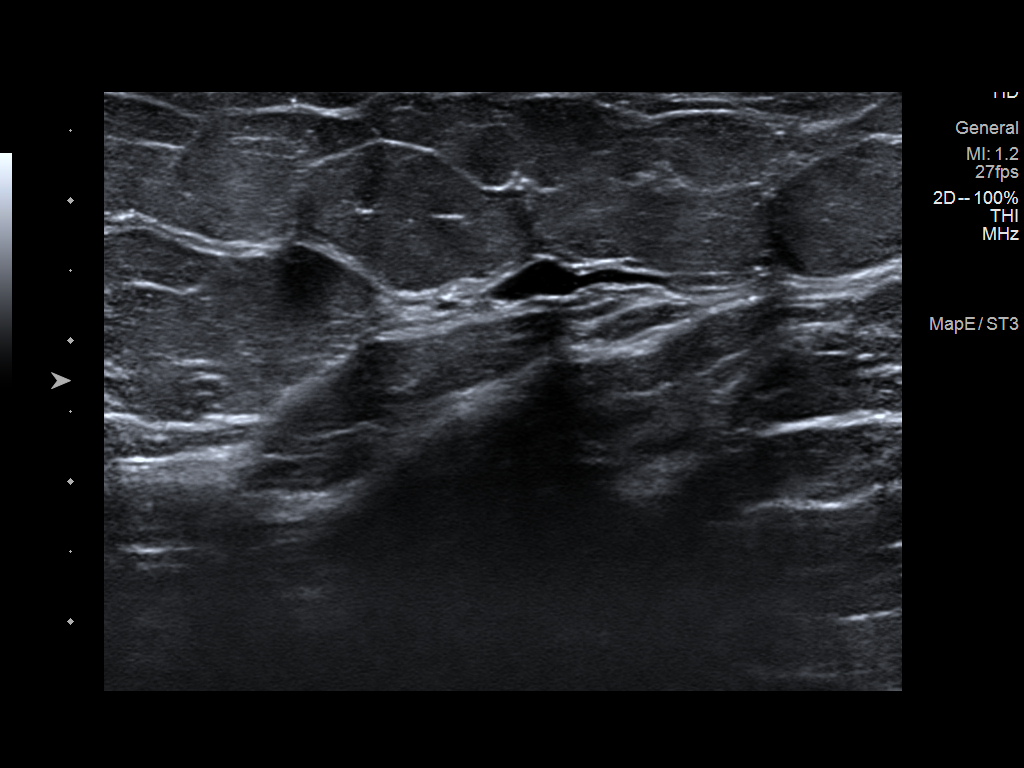
[im 4/5]
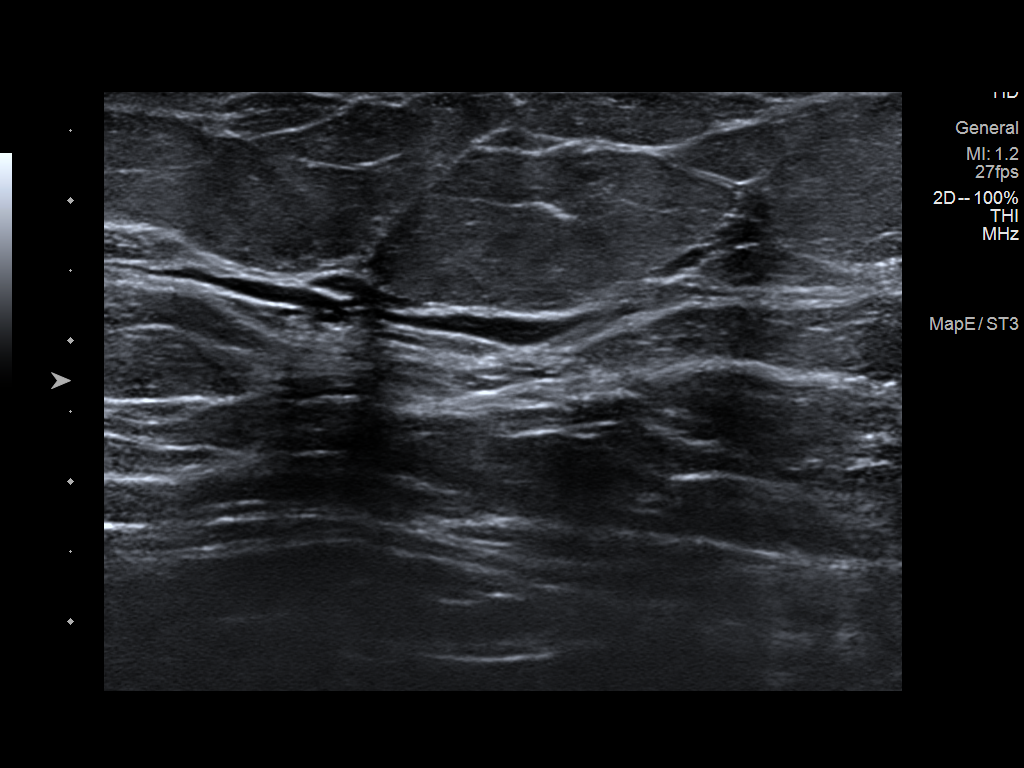
[im 5/5]
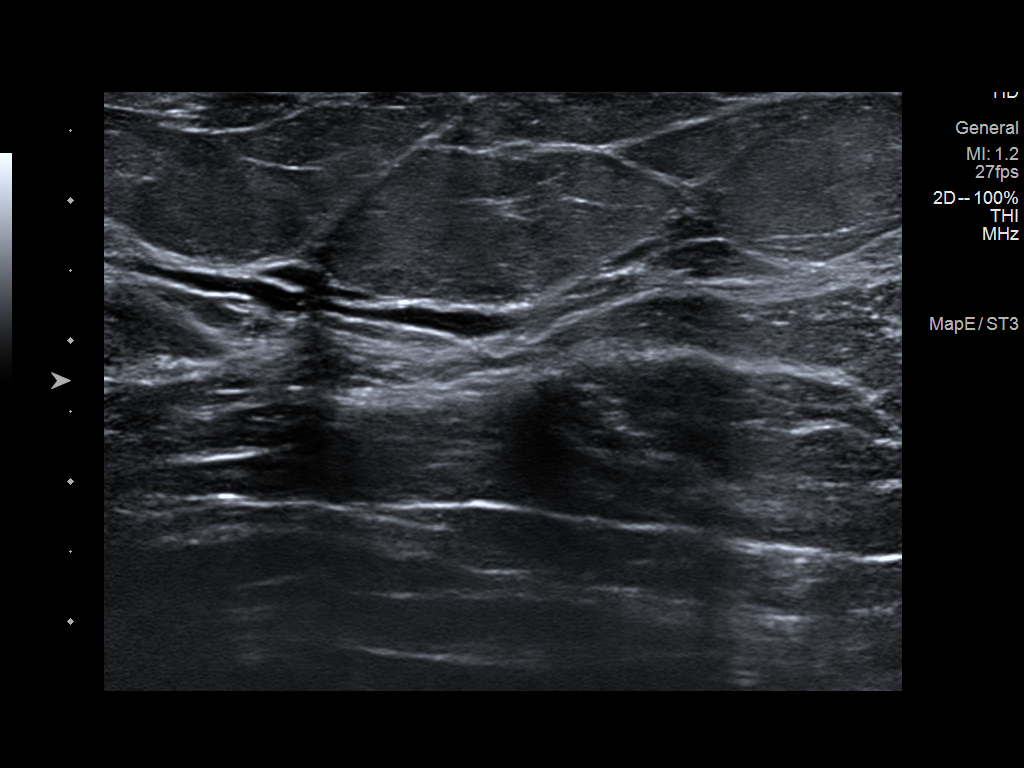

[5 of 5 positions shown; findings below may reference images not displayed]

ACR Breast Density Category b: There are scattered areas of
fibroglandular density.
FINDINGS: 2D/3D spot compression views of the RIGHT breast demonstrate a less
conspicuous 0.4 cm asymmetry within the UPPER RIGHT breast, only
well identified on the MLO view.

Targeted ultrasound is performed, showing no sonographic
abnormalities within the UPPER or OUTER RIGHT breast.
IMPRESSION: 1. Less conspicuous UPPER RIGHT breast asymmetry without sonographic
correlate, more likely benign or normal fibroglandular tissue.
Six-month follow-up is recommended to ensure stability.

RECOMMENDATION:
RIGHT diagnostic mammogram with possible RIGHT breast ultrasound in
6 months.

I have discussed the findings and recommendations with the patient.
If applicable, a reminder letter will be sent to the patient
regarding the next appointment.

BI-RADS CATEGORY  3: Probably benign.

## 2023-11-22 ENCOUNTER — Other Ambulatory Visit: Payer: Self-pay | Admitting: Nurse Practitioner

## 2023-11-22 DIAGNOSIS — Z1231 Encounter for screening mammogram for malignant neoplasm of breast: Secondary | ICD-10-CM

## 2023-11-22 DIAGNOSIS — E1165 Type 2 diabetes mellitus with hyperglycemia: Secondary | ICD-10-CM

## 2023-11-22 DIAGNOSIS — E559 Vitamin D deficiency, unspecified: Secondary | ICD-10-CM

## 2023-11-22 DIAGNOSIS — E785 Hyperlipidemia, unspecified: Secondary | ICD-10-CM

## 2023-11-30 ENCOUNTER — Ambulatory Visit

## 2023-12-08 ENCOUNTER — Ambulatory Visit

## 2023-12-13 ENCOUNTER — Ambulatory Visit

## 2023-12-14 ENCOUNTER — Telehealth: Admitting: Medical

## 2023-12-14 VITALS — Temp 97.0°F | Wt 345.0 lb

## 2023-12-14 DIAGNOSIS — J069 Acute upper respiratory infection, unspecified: Secondary | ICD-10-CM

## 2023-12-14 NOTE — Progress Notes (Signed)
 Subjective:     Patient ID: Haley Stanley, female   DOB: 06/26/1956, 68 y.o.   MRN: [id number]  This visit type was conducted due to national recommendations for restrictions regarding the COVID-19 Pandemic (e.g. social distancing) in an effort to limit this patient's exposure and mitigate transmission in our community.  Due to their co-morbid illnesses, this patient is at least at moderate risk for complications without adequate follow up.  This format is felt to be most appropriate for this patient at this time.    Documentation for virtual audio and video telecommunications through [facility] encounter:  The patient was located at home. The provider was located in the office. The patient did consent to this visit and is aware of possible charges through their insurance for this visit.  The other persons participating in this telemedicine service were none. Time spent on call was 20 minutes and in review of previous records 20 minutes total.  This virtual service is not related to other E/M service within previous 7 days.   HPI Chief Complaint  Patient presents with   Acute Visit    Cold symptoms since 9pm Sunday. Symptoms- cough, sneezing, mucous in nose, body aches, no fever or HA   Here for cold symptoms.   Has had symptoms since Sunday 4 days ago.  Feels cough, sneezing, congestion in nose.  Has some sore throat.  No glands swollen.   Has slight headache.  Hoarseness +.  No ear pain.  No NVD.  No fever, some mild+ body aches or chills.  Was around sick contact this past week.   No home test for covid or flu.    Last illenss was long time ago.  Nonsmoker.  Has hx/o asthma.   Not having to use inhaler currently.   No SOB or wheezing.   Using liquid mucinex fast max, tylenol .    Is using salt water gargles.   No other aggravating or relieving factors. No other complaint.  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 12 tablet 1   metFORMIN  (GLUCOPHAGE -XR) 500 MG 24 hr tablet Take 1 tablet by mouth once daily with breakfast 30 tablet 0   Omega-3 Fatty Acids (FISH OIL) 1200 MG CAPS Take 2 capsules by mouth daily.     valsartan  (DIOVAN ) 80 MG tablet Take 1 tablet by mouth once daily 30 tablet 0   acetaminophen  (TYLENOL ) 500 MG tablet Take 1,000 mg by mouth every 6 (six) hours as needed.     Blood Glucose Monitoring Suppl (ONETOUCH VERIO) w/Device KIT Check FSBS daily 1 kit 0   glucose blood test strip For testing  blood sugar up to 3 times a day for Diabetes. 100 each 6   OneTouch Delica Lancets 33G MISC 100 applicators by Does not apply route daily. 100 each 6   No current facility-administered medications on file prior to visit.    Review of Systems As in subjective    Objective:   Physical Exam Due to coronavirus pandemic stay at home measures, patient visit was virtual and they were not examined in person.   Temp (!) 97 F (36.1 C)   Wt (!) 345 lb (156.5 kg)   BMI 65.19 kg/m   Gen: wd, wn, nad Somewhat hoarse, coughing occasionally, but otherwise answers questions appropriately     Assessment:     Encounter Diagnosis   Name Primary?   Viral URI Yes       Plan:     We discussed his symptoms and timeframe suggest a viral URI at this point.  Specific home care recommendations today include: Continue the Mucinex max twice a day for the next few days to help with symptoms.   She declines other cough syrup or inhaler refill today. Sore throat remedies:  You may use salt water gargles, warm fluids such as coffee or hot tea, or honey/tea/lemon mixture to sooth sore throat pain.  You may use OTC sore throat remedies such as Cepacol lozenges or Chloraseptic spray for sore throat pain. Runny nose and sneezing remedies: You may use OTC antihistamine such as Zyrtec or Benadryl, but caution as these can cause drowsiness.   Pain/fever relief: You may use over-the-counter Tylenol  for pain or fever Drink extra fluids. Fluids help thin the mucus so your sinuses can drain more easily.  Applying either moist heat or ice packs to the sinus areas may help relieve discomfort. Use saline nasal sprays to help moisten your sinuses. The sprays can be found at your local drugstore.   Patient was advised to call or return if worse or not improving in the next few days.    Patient voiced understanding of diagnosis, recommendations, and treatment plan.  [name] was seen today for acute visit.  Diagnoses and all orders for this visit:  Viral URI  F/u prn

## 2023-12-15 ENCOUNTER — Ambulatory Visit: Admitting: Family Medicine

## 2023-12-19 ENCOUNTER — Ambulatory Visit

## 2023-12-19 DIAGNOSIS — J069 Acute upper respiratory infection, unspecified: Secondary | ICD-10-CM | POA: Diagnosis not present

## 2023-12-19 DIAGNOSIS — Z20822 Contact with and (suspected) exposure to covid-19: Secondary | ICD-10-CM | POA: Diagnosis not present

## 2023-12-22 ENCOUNTER — Ambulatory Visit
Admission: RE | Admit: 2023-12-22 | Discharge: 2023-12-22 | Disposition: A | Discharge status: Home / Self Care | Source: Ambulatory Visit | Attending: Nurse Practitioner | Admitting: Nurse Practitioner

## 2023-12-22 DIAGNOSIS — Z1231 Encounter for screening mammogram for malignant neoplasm of breast: Secondary | ICD-10-CM | POA: Diagnosis not present

## 2023-12-24 ENCOUNTER — Other Ambulatory Visit: Payer: Self-pay | Admitting: Nurse Practitioner

## 2023-12-24 DIAGNOSIS — E559 Vitamin D deficiency, unspecified: Secondary | ICD-10-CM

## 2023-12-24 DIAGNOSIS — E1169 Type 2 diabetes mellitus with other specified complication: Secondary | ICD-10-CM

## 2023-12-24 DIAGNOSIS — E1165 Type 2 diabetes mellitus with hyperglycemia: Secondary | ICD-10-CM

## 2023-12-29 ENCOUNTER — Encounter: Payer: Self-pay | Admitting: Nurse Practitioner

## 2024-01-10 ENCOUNTER — Ambulatory Visit (INDEPENDENT_AMBULATORY_CARE_PROVIDER_SITE_OTHER): Payer: Medicare Other

## 2024-01-10 DIAGNOSIS — Z0000 Encounter for general adult medical examination without abnormal findings: Secondary | ICD-10-CM

## 2024-01-10 NOTE — Progress Notes (Signed)
 Subjective:   Haley Stanley is a 68 y.o. who presents for a Medicare Wellness preventive visit.  As a reminder, Annual Wellness Visits don't include a physical exam, and some assessments may be limited, especially if this visit is performed virtually. We may recommend an in-person follow-up visit with your provider if needed.  Visit Complete: Virtual I connected with  Haley Stanley on 01/10/24 by a video and audio enabled telemedicine application and verified that I am speaking with the correct person using two identifiers.  Patient Location: Home  Provider Location: Office/Clinic  I discussed the limitations of evaluation and management by telemedicine. The patient expressed understanding and agreed to proceed.  Vital Signs: Because this visit was a virtual/telehealth visit, some criteria may be missing or patient reported. Any vitals not documented were not able to be obtained and vitals that have been documented are patient reported.    Persons Participating in Visit: Patient.  AWV Questionnaire: Yes: Patient Medicare AWV questionnaire was completed by the patient on 01/07/2024; I have confirmed that all information answered by patient is correct and no changes since this date.  Cardiac Risk Factors include: advanced age (>36men, >55 women);diabetes mellitus;dyslipidemia     Objective:     Today's Vitals   01/10/24 0917  PainSc: 7    There is no height or weight on file to calculate BMI.     01/10/2024    9:23 AM 01/04/2023    9:44 AM 12/25/2021    9:46 AM 10/29/2015   11:54 PM  Advanced Directives  Does Patient Have a Medical Advance Directive? No No No No  Would patient like information on creating a medical advance directive? No - Patient declined       Current Medications (verified) Outpatient Encounter Medications as of 01/10/2024  Medication Sig   acetaminophen  (TYLENOL ) 500 MG tablet Take 1,000 mg by mouth every 6 (six) hours as needed.   atorvastatin  (LIPITOR) 10  MG tablet Take 1 tablet by mouth once daily   Blood Glucose Monitoring Suppl (ONETOUCH VERIO) w/Device KIT Check FSBS daily   Cholecalciferol (VITAMIN D3) 1.25 MG (50000 UT) TABS Take 1 tablet by mouth once a week.   glucose blood test strip For testing blood sugar up to 3 times a day for Diabetes.   Omega-3 Fatty Acids (FISH OIL) 1200 MG CAPS Take 2 capsules by mouth daily.   OneTouch Delica Lancets 33G MISC 100 applicators by Does not apply route daily.   valsartan  (DIOVAN ) 80 MG tablet Take 1 tablet by mouth once daily   celecoxib  (CELEBREX ) 100 MG capsule Take 1 capsule by mouth twice daily (Patient not taking: Reported on 01/10/2024)   metFORMIN  (GLUCOPHAGE -XR) 500 MG 24 hr tablet Take 1 tablet by mouth once daily with breakfast (Patient not taking: Reported on 01/10/2024)   No facility-administered encounter medications on file as of 01/10/2024.    Allergies (verified) Patient has no known allergies.   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    Subclinical hypothyroidism 04/29/2021   Trichomonas infection 05/27/2021   Past Surgical History:  Procedure Laterality Date   CATARACT EXTRACTION Left 10/23/2021   gallstone removal     KNEE SURGERY     Family History  Problem Relation Age of Onset   Heart failure Mother    Breast cancer Neg Hx    Social History   Socioeconomic History   Marital status: Single    Spouse name: Not on file   Number of children: Not on file   Years of education: Not on file   Highest education level: 12th grade  Occupational  History   Not on file  Tobacco Use   Smoking status: Never   Smokeless tobacco: Never  Vaping Use   Vaping status: Never Used  Substance and Sexual Activity   Alcohol use: No   Drug use: No   Sexual activity: Not on file  Other Topics Concern   Not on file  Social History Narrative   Not on file   Social Drivers of Health   Financial Resource Strain: Low Risk  (01/10/2024)   Overall Financial Resource Strain (CARDIA)    Difficulty of Paying Living Expenses: Not hard at all  Food Insecurity: No Food Insecurity (01/10/2024)   Hunger Vital Sign    Worried About Running Out of Food in the Last Year: Never true    Ran Out of Food in the Last Year: Never true  Transportation Needs: No Transportation Needs (01/10/2024)   PRAPARE - Administrator, Civil Service (Medical): No    Lack of Transportation (Non-Medical): No  Physical Activity: Inactive (01/10/2024)   Exercise Vital Sign    Days of Exercise per Week: 0 days    Minutes of Exercise per Session: 0 min  Stress: No Stress Concern Present (01/10/2024)   [employer] of Occupational Health - Occupational Stress Questionnaire    Feeling of Stress : Not at all  Social Connections: Socially Isolated (01/10/2024)   Social Connection and Isolation Panel [NHANES]    Frequency of Communication with Friends and Family: More than three times a week    Frequency of Social Gatherings with Friends and Family: More than three times a week    Attends Religious Services: Never    Database administrator or Organizations: No    Attends Engineer, structural: Never    Marital Status: Never married    Tobacco Counseling Counseling given: Not Answered    Clinical Intake:  Pre-visit preparation completed: Yes  Pain : 0-10 Pain Score: 7  Pain Type: Chronic pain Pain Location: Shoulder Pain Orientation: Left Pain Descriptors / Indicators: Aching Pain Onset: 1 to 4 weeks ago Pain Frequency: Constant      Nutritional Risks: None Diabetes: Yes CBG done?: No Did pt. bring in CBG monitor from home?: No  Lab Results  Component Value Date   HGBA1C 7.0 (H) 11/29/2022   HGBA1C 6.5 (H) 01/01/2022   HGBA1C 6.3 (A) 08/05/2021     How often do you need to have someone help you when you read instructions, pamphlets, or other written materials from your doctor or pharmacy?: 1 - Never  Interpreter Needed?: No  Information entered by :: NAllen LPN   Activities of Daily Living     01/07/2024    9:56 AM  In your present state of health, do you have any difficulty performing the following activities:  Hearing? 0  Vision? 0  Difficulty concentrating or making decisions? 0  Walking or climbing stairs? 1  Dressing or bathing? 0  Doing errands, shopping?  0  Preparing Food and eating ? N  Using the Toilet? N  In the past six months, have you accidently leaked urine? N  Do you have problems with loss of bowel control? N  Managing your Medications? N  Managing your Finances? N  Housekeeping or managing your Housekeeping? N    Patient Care Team: Early, Adriane Albe, NP as PCP - [facility] (Ophthalmology)  Indicate any recent Medical Services you may have received from other than [facility] providers in the past year (date may be approximate).     Assessment:    This is a routine wellness examination for Haley Stanley.  Hearing/Vision screen Hearing Screening - Comments:: Denies hearing issues Vision Screening - Comments:: Regular eye exams, [facility]   Goals Addressed             This Visit's Progress    Patient Stated       01/10/2024, wants to lose weight       Depression Screen     01/10/2024    9:24 AM 01/04/2023    9:46 AM 11/29/2022   11:44 AM 01/01/2022   10:23 AM 12/25/2021    9:47 AM 04/28/2021   10:31 AM  PHQ 2/9 Scores  PHQ - 2 Score 0 0 0 0 0 0  PHQ- 9 Score 0 6  0 0     Fall Risk     01/07/2024    9:56 AM 01/04/2023    9:45 AM  12/31/2022   11:56 AM 11/29/2022   11:44 AM 01/01/2022   10:23 AM  Fall Risk   Falls in the past year? 0 1 1 1  0  Comment  tangled up in feet     Number falls in past yr: 0 0 0 0 0  Injury with Fall? 0 1 1 1  0  Comment  fractured shoulder     Risk for fall due to : Medication side effect Impaired mobility;Impaired balance/gait;Medication side effect  Other (Comment) No Fall Risks  Follow up Falls evaluation completed;Falls prevention discussed Falls prevention discussed;Education provided;Falls evaluation completed  Falls evaluation completed Falls evaluation completed    MEDICARE RISK AT HOME:  Medicare Risk at Home Any stairs in or around the home?: (Patient-Rptd) No Home free of loose throw rugs in walkways, pet beds, electrical cords, etc?: (Patient-Rptd) Yes Adequate lighting in your home to reduce risk of falls?: (Patient-Rptd) Yes Life alert?: (Patient-Rptd) No Use of a cane, walker or w/c?: (Patient-Rptd) Yes Grab bars in the bathroom?: (Patient-Rptd) Yes Shower chair or bench in shower?: (Patient-Rptd) Yes Elevated toilet seat or a handicapped toilet?: (Patient-Rptd) No  TIMED UP AND GO:  Was the test performed?  No  Cognitive Function: 6CIT completed        01/10/2024    9:26 AM 01/04/2023    9:48 AM 12/25/2021    9:48 AM  6CIT Screen  What Year? 0 points 0 points 0 points  What month? 0 points 0 points 0 points  What time? 3 points 0 points 0 points  Count back from 20 0 points 0 points 0 points  Months in reverse 0 points 0 points 0 points  Repeat phrase 0 points 0 points 0 points  Total Score 3 points 0 points 0 points    Immunizations Immunization History  Administered Date(s) Administered   Fluad Quad(high Dose 65+) 04/28/2021   PFIZER Comirnaty(Gray Top)Covid-19 Tri-Sucrose Vaccine 09/28/2019, 10/19/2019, 08/20/2020, 04/09/2021   Pfizer Covid-19 Theatre manager  64yrs & up 08/05/2021   Pneumococcal Polysaccharide-23 05/05/2021   Tdap 10/30/2015    Zoster Recombinant(Shingrix ) 01/01/2022, 11/29/2022    Screening Tests Health Maintenance  Topic Date Due   Colonoscopy  Never done   Pneumonia Vaccine 28+ Years old (2 of 2 - PCV) 05/05/2022   Diabetic kidney evaluation - Urine ACR  08/05/2022   FOOT EXAM  08/05/2022   OPHTHALMOLOGY EXAM  11/03/2022   COVID-19 Vaccine (6 - 2024-25 season) 04/24/2023   HEMOGLOBIN A1C  05/31/2023   Diabetic kidney evaluation - eGFR measurement  11/29/2023   INFLUENZA VACCINE  03/23/2024   Medicare Annual Wellness (AWV)  01/09/2025   DTaP/Tdap/Td (2 - Td or Tdap) 10/29/2025   MAMMOGRAM  12/21/2025   DEXA SCAN  Completed   Hepatitis C Screening  Completed   Zoster Vaccines- Shingrix   Completed   HPV VACCINES  Aged Out   Meningococcal B Vaccine  Aged Out    Health Maintenance  Health Maintenance Due  Topic Date Due   Colonoscopy  Never done   Pneumonia Vaccine 1+ Years old (2 of 2 - PCV) 05/05/2022   Diabetic kidney evaluation - Urine ACR  08/05/2022   FOOT EXAM  08/05/2022   OPHTHALMOLOGY EXAM  11/03/2022   COVID-19 Vaccine (6 - 2024-25 season) 04/24/2023   HEMOGLOBIN A1C  05/31/2023   Diabetic kidney evaluation - eGFR measurement  11/29/2023   Health Maintenance Items Addressed: Declines colonoscopy. Due for pneumonia and covid vaccine. Has appointment July 22 for foot exam, urine and A1C.  Additional Screening:  Vision Screening: Recommended annual ophthalmology exams for early detection of glaucoma and other disorders of the eye.  Dental Screening: Recommended annual dental exams for proper oral hygiene  Community Resource Referral / Chronic Care Management: CRR required this visit?  No   CCM required this visit?  No   Plan:    I have personally reviewed and noted the following in the patient's chart:   Medical and social history Use of alcohol, tobacco or illicit drugs  Current medications and supplements including opioid prescriptions. Patient is not currently taking  opioid prescriptions. Functional ability and status Nutritional status Physical activity Advanced directives List of other physicians Hospitalizations, surgeries, and ER visits in previous 12 months Vitals Screenings to include cognitive, depression, and falls Referrals and appointments  In addition, I have reviewed and discussed with patient certain preventive protocols, quality metrics, and best practice recommendations. A written personalized care plan for preventive services as well as general preventive health recommendations were provided to patient.   Areatha Beecham, LPN   1/61/0960   After Visit Summary: (MyChart) Due to this being a telephonic visit, the after visit summary with patients personalized plan was offered to patient via MyChart   Notes: Nothing significant to report at this time.

## 2024-01-10 NOTE — Patient Instructions (Signed)
 Ms. Larocque , Thank you for taking time out of your busy schedule to complete your Annual Wellness Visit with me. I enjoyed our conversation and look forward to speaking with you again next year. I, as well as your care team,  appreciate your ongoing commitment to your health goals. Please review the following plan we discussed and let me know if I can assist you in the future. Your Game plan/ To Do List    Referrals: If you haven't heard from the office you've been referred to, please reach out to them at the phone provided.  N/a Follow up Visits: Next Medicare AWV with our clinical staff: 01/22/2025 at 8:50   Have you seen your provider in the last 6 months (3 months if uncontrolled diabetes)? Yes Next Office Visit with your provider: 03/13/2024 at 1:45  Clinician Recommendations:  Aim for 30 minutes of exercise or brisk walking, 6-8 glasses of water, and 5 servings of fruits and vegetables each day.       This is a list of the screening recommended for you and due dates:  Health Maintenance  Topic Date Due   Colon Cancer Screening  Never done   Pneumonia Vaccine (2 of 2 - PCV) 05/05/2022   Yearly kidney health urinalysis for diabetes  08/05/2022   Complete foot exam   08/05/2022   Eye exam for diabetics  11/03/2022   COVID-19 Vaccine (6 - 2024-25 season) 04/24/2023   Hemoglobin A1C  05/31/2023   Yearly kidney function blood test for diabetes  11/29/2023   Flu Shot  03/23/2024   Medicare Annual Wellness Visit  01/09/2025   DTaP/Tdap/Td vaccine (2 - Td or Tdap) 10/29/2025   Mammogram  12/21/2025   DEXA scan (bone density measurement)  Completed   Hepatitis C Screening  Completed   Zoster (Shingles) Vaccine  Completed   HPV Vaccine  Aged Out   Meningitis B Vaccine  Aged Out    Advanced directives: (ACP Link)Information on Advanced Care Planning can be found at [place]  Secretary of [facility] Advance Health Care Directives Advance Health Care Directives. [email or web address]  Advance Care  Planning is important because it:  [x]  Makes sure you receive the medical care that is consistent with your values, goals, and preferences  [x]  It provides guidance to your family and loved ones and reduces their decisional burden about whether or not they are making the right decisions based on your wishes.  Follow the link provided in your after visit summary or read over the paperwork we have mailed to you to help you started getting your Advance Directives in place. If you need assistance in completing these, please reach out to us  so that we can help you!  See attachments for Preventive Care and Fall Prevention Tips.

## 2024-02-17 NOTE — Telephone Encounter (Signed)
 Copied from CRM [id number]. Topic: Appointments - Scheduling Inquiry for Clinic >> Feb 16, 2024  8:53 AM Graeme ORN wrote: Reason for CRM: Patient called to reschedule appt for 7/22. Next available 2/9. Scheduled and added to wait list. If something is available before then please let patient know. Prefers after 3pm. Thank You    ----------------------------------------------------------------------- From previous Reason for Contact - Cancel/Reschedule: Patient/patient representative is calling to cancel or reschedule an appointment. Refer to attachments for appointment information.

## 2024-03-13 ENCOUNTER — Encounter: Admitting: Nurse Practitioner

## 2024-04-30 LAB — FECAL OCCULT BLOOD, IMMUNOCHEMICAL: IFOBT: NEGATIVE

## 2024-05-09 ENCOUNTER — Encounter: Payer: Self-pay | Admitting: Nurse Practitioner

## 2024-06-04 ENCOUNTER — Encounter: Admitting: Nurse Practitioner

## 2024-06-14 NOTE — Progress Notes (Signed)
 Haley Stanley                                          MRN: [id number]   06/14/2024   The [facility]I Quality Team Specialist reviewed this patient medical record for the purposes of chart review for care gap closure. The following were reviewed: chart review for care gap closure-glycemic status assessment.    VBCI Quality Team

## 2024-06-27 LAB — OPHTHALMOLOGY REPORT-SCANNED

## 2024-08-01 ENCOUNTER — Ambulatory Visit: Payer: Self-pay

## 2024-08-01 ENCOUNTER — Encounter: Payer: Self-pay | Admitting: Family Medicine

## 2024-08-01 ENCOUNTER — Ambulatory Visit: Admitting: Family Medicine

## 2024-08-01 VITALS — BP 144/80 | HR 92 | Temp 99.5°F | Ht 61.0 in | Wt 303.0 lb

## 2024-08-01 DIAGNOSIS — E1169 Type 2 diabetes mellitus with other specified complication: Secondary | ICD-10-CM

## 2024-08-01 DIAGNOSIS — E1165 Type 2 diabetes mellitus with hyperglycemia: Secondary | ICD-10-CM

## 2024-08-01 DIAGNOSIS — E785 Hyperlipidemia, unspecified: Secondary | ICD-10-CM

## 2024-08-01 DIAGNOSIS — E1159 Type 2 diabetes mellitus with other circulatory complications: Secondary | ICD-10-CM

## 2024-08-01 DIAGNOSIS — J4521 Mild intermittent asthma with (acute) exacerbation: Secondary | ICD-10-CM | POA: Diagnosis not present

## 2024-08-01 DIAGNOSIS — I152 Hypertension secondary to endocrine disorders: Secondary | ICD-10-CM

## 2024-08-01 MED ORDER — AZITHROMYCIN 250 MG PO TABS: ORAL_TABLET | ORAL | 0 refills | Status: AC

## 2024-08-01 MED ORDER — ALBUTEROL SULFATE HFA 108 (90 BASE) MCG/ACT IN AERS: 2.0000 | INHALATION_SPRAY | Freq: Four times a day (QID) | RESPIRATORY_TRACT | 0 refills | Status: AC | PRN

## 2024-08-01 NOTE — Patient Instructions (Addendum)
°  Stay well hydrated--drink plenty of water. Stop using the Mucinex Fast-Max all in one. Instead get Mucinex DM 12 hour tablets, and take it twice daily. This has an expectorant to loosen any mucus or phlegm, and has the same cough suppressant (but one that lasts for 12 hours). I'm prescribing an antibiotic to treat for a bacterial illness--given the length of symptoms, yellow phlegm, and lowgrade fever. I'm also sending in a refill of your albuterol, so that you have one that isn't expired. Use this as needed for any wheezing or shortness of breath. If you aren't getting better, or if you are worse--especially if higher fever, worsening shortness of breath, pain with breathing, please be sure to return for re-evaluation.   Consider using an antihistamine to help dry up any nasal drainage (claritin, zyrtec, or chlortrimeton, which may be more sedating). Consider doing sinus rinses with either Sinus Rinse Kit or a Neti-pot, using either distilled/bottled water, or boiled water (don't use plain tap water, must boil first).  You can try using this prior to going to bed, to clear out any mucus before it runs down the throat when you lay down. Some people also like doing this in the morning.  Please set up a follow-up visit with your PCP. You are past due to have labs drawn, including your cholesterol. Be sure to come fasting. If you can please try and check some sugars periodically, and bring the list to your visit. You will have your A1c checked then.  If your A1c remains 7 or greater, you should be started on another medication. Even if it is lower, you can be started on diabetes medications that might help with weight loss. We should avoid the metformin  if you didn't tolerate it due to significant diarrhea. There are many other medication options.

## 2024-08-01 NOTE — Telephone Encounter (Signed)
 FYI Only or Action Required?: FYI only for provider: appointment scheduled on 12/10.  Patient was last seen in primary care on 12/14/2023 by Bulah Alm RAMAN, PA-C.  Called Nurse Triage reporting Cough.  Symptoms began x 3 weeks.  Interventions attempted: OTC medications: Mucinex.  Symptoms are: unchanged.  Triage Disposition: See Physician Within 24 Hours  Patient/caregiver understands and will follow disposition?: Yes          Copied from CRM #[id number]. Topic: Clinical - Red Word Triage >> Aug 01, 2024  9:37 AM Haley Stanley wrote: Red Word that prompted transfer to Nurse Triage:  Symptoms: -coughing for 3 weeks feels it's getting worse -have to sleep sitting up -rattling when breathing at night  -diarrhea for 3 days Reason for Disposition  [1] Continuous (nonstop) coughing interferes with work or school AND [2] no improvement using cough treatment per Care Advice  Answer Assessment - Initial Assessment Questions 1. ONSET: When did the cough begin?      Ongoing x 3 weeks    2. SEVERITY: How bad is the cough today?      Moderate  3. SPUTUM: Describe the color of your sputum (e.g., none, dry cough; clear, white, yellow, green)     Yellow   4. HEMOPTYSIS: Are you coughing up any blood? If Yes, ask: How much? (e.g., flecks, streaks, tablespoons, etc.)     No   5. DIFFICULTY BREATHING: Are you having difficulty breathing? If Yes, ask: How bad is it? (e.g., mild, moderate, severe)      No   6. FEVER: Do you have a fever? If Yes, ask: What is your temperature, how was it measured, and when did it start?     No    7. CARDIAC HISTORY: Do you have any history of heart disease? (e.g., heart attack, congestive heart failure)      No   8. LUNG HISTORY: Do you have any history of lung disease?  (e.g., pulmonary embolus, asthma, emphysema)      Asthma, not using inhaler        10. OTHER SYMPTOMS: Do you have any other symptoms? (e.g., runny nose,  wheezing, chest pain)  No   12. TRAVEL: Have you traveled out of the country in the last month? (e.g., travel history, exposures)       No   Patient called in to triage with complaints of productive cough. This has been ongoing for 3 weeks.  The patient stated she  has had a cough intermittently for 3 weeks. The cough worsens at night, and she is unable to lay flat. The cough sounds like crackles.  For home care, the patient is taking OTC Mucinex Appointment scheduled for further evaluation; and agrees with the plan of care, and will reach out if symptoms worsen or persist.  Protocols used: Cough - Acute Productive-A-AH

## 2024-08-01 NOTE — Progress Notes (Signed)
 Chief Complaint  Patient presents with   Cough    Cough x 3 weeks, wheezing. No fever,chills or body aches. Can't lay down at night. Was taking Mucinex All In One. Has not taken in a couple of days. Did not do any covid testing.     Patient presents with complaint of a productive cough x 3 weeks, productive of yellow sputum.  Started out with runny nose and sore throat, like a regular cold, but hasn't improved. She struggles to get up the mucus, what she can get up is yellow. The cough worsens at night, and she is unable to lay flat.  She denies postnasal drainage. Doesn't feel very tight like asthma. She used albuterol a couple of times, which didn't help her feel better. She thinks it is expired. Occasionally has some DOE.  Her daughter told her she sounded like she was wheezing prior to her visit, when she was rushing around. She did not have her inhaler around--thinks her old one is expired.  She is taking Mucinex Fast Max (acetaminophen  and dextromethorphan), which hasn't been helping.  68 yo granddaughter is sick, but patient was sick first, thinks she gave it to her. +exposure to a coworker who had RSV, prior to Thanksgiving (prior to onset of her symptoms).   PMH, PSH, SH reviewed  DM, HTN, HLD  Lab Results  Component Value Date   HGBA1C 7.0 (H) 11/29/2022   Outpatient Encounter Medications as of 08/01/2024  Medication Sig Note   atorvastatin  (LIPITOR) 10 MG tablet Take 1 tablet by mouth once daily 08/01/2024: Did not take today   valsartan  (DIOVAN ) 80 MG tablet Take 1 tablet by mouth once daily 08/01/2024: Did not take today    [DISCONTINUED] Cholecalciferol (VITAMIN D3) 1.25 MG (50000 UT) TABS Take 1 tablet by mouth once a week.    acetaminophen  (TYLENOL ) 500 MG tablet Take 1,000 mg by mouth every 6 (six) hours as needed. (Patient not taking: Reported on 08/01/2024) 09/03/2021: Takes as needed    Blood Glucose Monitoring Suppl (ONETOUCH VERIO) w/Device KIT Check FSBS  daily (Patient not taking: Reported on 08/01/2024)    celecoxib  (CELEBREX ) 100 MG capsule Take 1 capsule by mouth twice daily (Patient not taking: Reported on 01/10/2024)    glucose blood test strip For testing blood sugar up to 3 times a day for Diabetes. (Patient not taking: Reported on 08/01/2024)    metFORMIN  (GLUCOPHAGE -XR) 500 MG 24 hr tablet Take 1 tablet by mouth once daily with breakfast (Patient not taking: Reported on 08/01/2024) 08/01/2024: Only took for a short bit--stopped due to diarrhea   Omega-3 Fatty Acids (FISH OIL) 1200 MG CAPS Take 2 capsules by mouth daily. (Patient not taking: Reported on 08/01/2024)    OneTouch Delica Lancets 33G MISC 100 applicators by Does not apply route daily. (Patient not taking: Reported on 08/01/2024)    No facility-administered encounter medications on file as of 08/01/2024.   Hasn't taken her valsartan  or lipitor today  No Known Allergies   ROS: no f/c. URI symptoms, cough, SOB per HPI. She has decreased appetite.  Diarrhea x 3 days--loose stools, not watery. Denies abdominal pain, blood in stool. Denies urinary symptoms. Denies chest pain, edema.   PHYSICAL EXAM:  BP (!) 144/80 (BP Location: Left Arm, Cuff Size: Large)   Pulse 92   Temp 99.5 F (37.5 C) (Tympanic)   Ht 5' 1 (1.549 m)   Wt (!) 303 lb (137.4 kg)   SpO2 98%   BMI 57.25 kg/m  Wt Readings from Last 3 Encounters:  08/01/24 (!) 303 lb (137.4 kg)  12/14/23 (!) 345 lb (156.5 kg)  01/04/23 (!) 305 lb (138.3 kg)   Pleasant, obese female, with frequent dry hacky cough during visit. HEENT: conjunctiva and sclera are clear. TM's and EACs are normal. Nasal mucosa with mod edema, white mucus bilaterally, mild erythema. Sinuses are nontender. OP is clear. Neck: no lymphadenopathy or mass. Heart: regular rate and rhythm, 2/6 SEM throughout. Lungs: clear bilaterally, no wheezes, rales, or ronchi, but deep breaths triggered coughs Skin: normal turgor, no rash Psych:  normal mood, affect, hygiene and grooming Extremities: no edema Neuro: alert and oriented, cranial nerves grossly intact. Normal gait.  ASSESSMENT/PLAN:  Mild intermittent asthmatic bronchitis with acute exacerbation - Plan: albuterol (VENTOLIN HFA) 108 (90 Base) MCG/ACT inhaler, azithromycin (ZITHROMAX) 250 MG tablet  Type 2 diabetes mellitus with hyperglycemia, without long-term current use of insulin (HCC) - past due for f/u. Didn't tolerate metformin  d/t diarrhea. Encouraged to check glu, bring list to med check with PCP  Hyperlipidemia associated with type 2 diabetes mellitus (HCC) - past due for labs. Reports med compliance (doubt based on refill history). Encouraged fasting med check with PCP  Hypertension associated with diabetes (HCC) - BP elevated, hasn't taken med today. Encouraged low Na diet, taking meds regularly, and f/u for med check (not to wait until CPE in Feb)  Med RFd x 3 mos 12/2023. Not seen in our office since 11/2022, other than AWV by nurse. She cancelled appointments in July and October. Scheduled for CPE end of February with PCP. Asked to see PCP for fasting med check sooner than end of Feb, past due for labs, and needing med refills.   Stay well hydrated--drink plenty of water. Stop using the Mucinex Fast-Max all in one. Instead get Mucinex DM 12 hour tablets, and take it twice daily. This has an expectorant to loosen any mucus or phlegm, and has the same cough suppressant (but one that lasts for 12 hours). I'm prescribing an antibiotic to treat for a bacterial illness--given the length of symptoms, yellow phlegm, and lowgrade fever. I'm also sending in a refill of your albuterol, so that you have one that isn't expired. Use this as needed for any wheezing or shortness of breath. If you aren't getting better, or if you are worse--especially if higher fever, worsening shortness of breath, pain with breathing, please be sure to return for re-evaluation.  Consider  using an antihistamine to help dry up any nasal drainage (claritin, zyrtec, or chlortrimeton, which may be more sedating). Consider doing sinus rinses with either Sinus Rinse Kit or a Neti-pot, using either distilled/bottled water, or boiled water (don't use plain tap water, must boil first).  You can try using this prior to going to bed, to clear out any mucus before it runs down the throat when you lay down. Some people also like doing this in the morning.

## 2024-08-21 ENCOUNTER — Telehealth: Payer: Self-pay | Admitting: Internal Medicine

## 2024-08-21 ENCOUNTER — Encounter: Payer: Self-pay | Admitting: Nurse Practitioner

## 2024-08-21 ENCOUNTER — Ambulatory Visit: Admitting: Nurse Practitioner

## 2024-08-21 VITALS — BP 162/100 | HR 78 | Ht 60.0 in | Wt 300.8 lb

## 2024-08-21 DIAGNOSIS — R011 Cardiac murmur, unspecified: Secondary | ICD-10-CM | POA: Diagnosis not present

## 2024-08-21 DIAGNOSIS — Z23 Encounter for immunization: Secondary | ICD-10-CM | POA: Diagnosis not present

## 2024-08-21 DIAGNOSIS — E785 Hyperlipidemia, unspecified: Secondary | ICD-10-CM

## 2024-08-21 DIAGNOSIS — E1159 Type 2 diabetes mellitus with other circulatory complications: Secondary | ICD-10-CM

## 2024-08-21 DIAGNOSIS — E1169 Type 2 diabetes mellitus with other specified complication: Secondary | ICD-10-CM | POA: Diagnosis not present

## 2024-08-21 DIAGNOSIS — E038 Other specified hypothyroidism: Secondary | ICD-10-CM

## 2024-08-21 DIAGNOSIS — E559 Vitamin D deficiency, unspecified: Secondary | ICD-10-CM

## 2024-08-21 DIAGNOSIS — I152 Hypertension secondary to endocrine disorders: Secondary | ICD-10-CM | POA: Diagnosis not present

## 2024-08-21 DIAGNOSIS — E1165 Type 2 diabetes mellitus with hyperglycemia: Secondary | ICD-10-CM

## 2024-08-21 MED ORDER — MOUNJARO 2.5 MG/0.5ML ~~LOC~~ SOAJ: 2.5000 mg | SUBCUTANEOUS | 0 refills | Status: DC

## 2024-08-21 MED ORDER — VALSARTAN-HYDROCHLOROTHIAZIDE 160-12.5 MG PO TABS: 1.0000 | ORAL_TABLET | Freq: Every day | ORAL | 0 refills | Status: AC

## 2024-08-21 MED ORDER — ATORVASTATIN CALCIUM 10 MG PO TABS: 10.0000 mg | ORAL_TABLET | Freq: Every day | ORAL | 0 refills | Status: AC

## 2024-08-21 NOTE — Telephone Encounter (Unsigned)
 Copied from CRM #[id number]. Topic: Clinical - Medication Question >> Aug 21, 2024 11:12 AM [name] wrote: Reason for CRM:  Cost of tirzepatide (MOUNJARO) 2.5 MG/0.5ML Pen is  $364.84 Patient  would like Camie Doing NP to prescribe something else    [id number] - [place] ([place]), [place] - [address] [place] ([place]) [place] [address] Phone: [phone] Fax: [phone] Hours: Not open 24 hours

## 2024-08-21 NOTE — Assessment & Plan Note (Signed)
 Haley Stanley

## 2024-08-21 NOTE — Assessment & Plan Note (Signed)
 Weight management is a concern, and Mounjaro is expected to aid in weight loss by decreasing appetite and cravings. - Prescribed Mounjaro, pending insurance coverage, for weight management.

## 2024-08-21 NOTE — Patient Instructions (Addendum)
 I would like you to increase your blood pressure medication. The valsartan  alone is not quite enough to help keep this under control.  I have sent in a new prescription for Valsartan -hydrochlorothiazide. This is a combination pill that works very well together and can help get the blood pressures under a little better control.   I have sent in the prescription for Mounjaro to see if we can get this covered by insurance. I will let you know if we need to make changes.   I sent the order for the ultrasound of the heart. They will call you to schedule this.   We will get you back in to see Dr. Vita about the knees and go over knee injection options.   Keep an eye on your BP and let me know if this is staying up.

## 2024-08-21 NOTE — Assessment & Plan Note (Signed)
 SABRA

## 2024-08-21 NOTE — Assessment & Plan Note (Signed)
 Previous thyroid  labs were abnormal. - Ordered thyroid  function tests to assess current thyroid  status. Orders:   TSH   T4, free

## 2024-08-21 NOTE — Progress Notes (Unsigned)
{  SEHM (Optional):34217}  Catheline Doing, DNP, AGNP-c [facility]  [address] [place], [place] [address] [phone]   ESTABLISHED PATIENT- Chronic Health and/or Follow-Up Visit on 08/21/2024  Blood pressure (!) 160/100, pulse 78, height 5' (1.524 m), weight (!) 300 lb 12.8 oz (136.4 kg), SpO2 98%.   Subjective:  other (Fasting med check-)   *** ROS negative except for what is listed in HPI. History, Medications, Surgery, SDOH, and Family History reviewed and updated as appropriate.  Objective:  Physical Exam Vitals and nursing note reviewed.  Constitutional:      Appearance: Normal appearance.  HENT:     Head: Normocephalic.  Eyes:     Pupils: Pupils are equal, round, and reactive to light.  Cardiovascular:     Rate and Rhythm: Normal rate and regular rhythm.     Pulses: Normal pulses.     Heart sounds: Normal heart sounds.  Pulmonary:     Effort: Pulmonary effort is normal.     Breath sounds: Normal breath sounds.  Musculoskeletal:        General: Normal range of motion.     Cervical back: Normal range of motion.     Right lower leg: No edema.     Left lower leg: No edema.  Skin:    General: Skin is warm and dry.     Capillary Refill: Capillary refill takes less than 2 seconds.  Neurological:     Mental Status: She is alert and oriented to person, place, and time.     Cranial Nerves: No cranial nerve deficit.     Sensory: No sensory deficit.     Coordination: Coordination normal.  Psychiatric:        Mood and Affect: Mood normal.         Assessment & Plan:   Assessment & Plan Type 2 diabetes mellitus with hyperglycemia, without long-term current use of insulin (HCC)     Hyperlipidemia associated with type 2 diabetes mellitus (HCC)     Vitamin D  deficiency     Need for influenza vaccination  Orders:   Flu vaccine HIGH DOSE PF(Fluzone Trivalent)  Need for COVID-19 vaccine  Orders:   Pfizer Comirnaty Covid-19 Vaccine  2yrs & older  Hypertension associated with diabetes (HCC)     Subclinical hypothyroidism     Morbid obesity (HCC)       Camie FORBES Doing, DNP, AGNP-c  {SETIMEYorN (Optional):34216}

## 2024-08-22 LAB — COMPREHENSIVE METABOLIC PANEL WITH GFR
ALT: 9 IU/L (ref 0–32)
AST: 12 IU/L (ref 0–40)
Albumin: 4.1 g/dL (ref 3.9–4.9)
Alkaline Phosphatase: 115 IU/L (ref 49–135)
BUN/Creatinine Ratio: 19 (ref 12–28)
BUN: 14 mg/dL (ref 8–27)
Bilirubin Total: 0.6 mg/dL (ref 0.0–1.2)
CO2: 19 mmol/L — ABNORMAL LOW (ref 20–29)
Calcium: 9.7 mg/dL (ref 8.7–10.3)
Chloride: 104 mmol/L (ref 96–106)
Creatinine, Ser: 0.75 mg/dL (ref 0.57–1.00)
Globulin, Total: 3.1 g/dL (ref 1.5–4.5)
Glucose: 128 mg/dL — ABNORMAL HIGH (ref 70–99)
Potassium: 4.1 mmol/L (ref 3.5–5.2)
Sodium: 139 mmol/L (ref 134–144)
Total Protein: 7.2 g/dL (ref 6.0–8.5)
eGFR: 87 mL/min/1.73

## 2024-08-22 LAB — CBC WITH DIFFERENTIAL/PLATELET
Basophils Absolute: 0 x10E3/uL (ref 0.0–0.2)
Basos: 1 %
EOS (ABSOLUTE): 0.2 x10E3/uL (ref 0.0–0.4)
Eos: 2 %
Hematocrit: 41.4 % (ref 34.0–46.6)
Hemoglobin: 13.6 g/dL (ref 11.1–15.9)
Immature Grans (Abs): 0 x10E3/uL (ref 0.0–0.1)
Immature Granulocytes: 0 %
Lymphocytes Absolute: 2.5 x10E3/uL (ref 0.7–3.1)
Lymphs: 31 %
MCH: 28.5 pg (ref 26.6–33.0)
MCHC: 32.9 g/dL (ref 31.5–35.7)
MCV: 87 fL (ref 79–97)
Monocytes Absolute: 0.4 x10E3/uL (ref 0.1–0.9)
Monocytes: 5 %
Neutrophils Absolute: 4.9 x10E3/uL (ref 1.4–7.0)
Neutrophils: 61 %
Platelets: 357 x10E3/uL (ref 150–450)
RBC: 4.77 x10E6/uL (ref 3.77–5.28)
RDW: 13 % (ref 11.7–15.4)
WBC: 7.9 x10E3/uL (ref 3.4–10.8)

## 2024-08-22 LAB — VITAMIN D 25 HYDROXY (VIT D DEFICIENCY, FRACTURES): Vit D, 25-Hydroxy: 21.2 ng/mL — ABNORMAL LOW (ref 30.0–100.0)

## 2024-08-22 LAB — MICROALBUMIN / CREATININE URINE RATIO
Creatinine, Urine: 183.9 mg/dL
Microalb/Creat Ratio: 5 mg/g{creat} (ref 0–29)
Microalbumin, Urine: 8.5 ug/mL

## 2024-08-22 LAB — LIPID PANEL
Chol/HDL Ratio: 3 ratio (ref 0.0–4.4)
Cholesterol, Total: 172 mg/dL (ref 100–199)
HDL: 58 mg/dL
LDL Chol Calc (NIH): 85 mg/dL (ref 0–99)
Triglycerides: 172 mg/dL — ABNORMAL HIGH (ref 0–149)
VLDL Cholesterol Cal: 29 mg/dL (ref 5–40)

## 2024-08-22 LAB — TSH: TSH: 3.14 u[IU]/mL (ref 0.450–4.500)

## 2024-08-22 LAB — HEMOGLOBIN A1C
Est. average glucose Bld gHb Est-mCnc: 140 mg/dL
Hgb A1c MFr Bld: 6.5 % — ABNORMAL HIGH (ref 4.8–5.6)

## 2024-08-22 LAB — T4, FREE: Free T4: 1 ng/dL (ref 0.82–1.77)

## 2024-08-22 MED ORDER — TRULICITY 0.75 MG/0.5ML ~~LOC~~ SOAJ: 0.7500 mg | SUBCUTANEOUS | 0 refills | Status: AC

## 2024-08-22 NOTE — Assessment & Plan Note (Signed)
 A soft cardiac murmur was detected, possibly exacerbated by elevated blood pressure. No symptoms suggestive of acute cardiac issues. - Ordered echocardiogram to evaluate the cardiac murmur. - Checked thyroid  function tests to rule out thyroid -related causes of the murmur. Orders:   ECHOCARDIOGRAM COMPLETE; Future

## 2024-08-22 NOTE — Telephone Encounter (Signed)
 Trulicity sent to see if this is an affordable option. We also can consider Ozempic.

## 2024-08-27 ENCOUNTER — Ambulatory Visit: Payer: Self-pay | Admitting: Nurse Practitioner

## 2024-08-27 ENCOUNTER — Encounter: Payer: Self-pay | Admitting: Nurse Practitioner

## 2024-08-27 DIAGNOSIS — E559 Vitamin D deficiency, unspecified: Secondary | ICD-10-CM

## 2024-08-27 MED ORDER — VITAMIN D (ERGOCALCIFEROL) 1.25 MG (50000 UNIT) PO CAPS: 50000.0000 [IU] | ORAL_CAPSULE | ORAL | 1 refills | Status: AC

## 2024-08-28 ENCOUNTER — Ambulatory Visit: Admitting: Family Medicine

## 2024-08-28 ENCOUNTER — Other Ambulatory Visit: Payer: Self-pay

## 2024-08-28 MED ORDER — LANCETS MISC: 1.0000 | 0 refills | Status: DC

## 2024-08-28 MED ORDER — BLOOD GLUCOSE MONITORING SUPPL DEVI: 1.0000 | 0 refills | Status: DC

## 2024-08-28 MED ORDER — BLOOD GLUCOSE TEST VI STRP: 1.0000 | ORAL_STRIP | 0 refills | Status: AC

## 2024-08-28 MED ORDER — LANCET DEVICE MISC: 1.0000 | 0 refills | Status: DC

## 2024-09-06 ENCOUNTER — Other Ambulatory Visit: Payer: Self-pay | Admitting: Nurse Practitioner

## 2024-09-06 DIAGNOSIS — E1165 Type 2 diabetes mellitus with hyperglycemia: Secondary | ICD-10-CM

## 2024-09-06 MED ORDER — LANCETS MISC: 1.0000 | 0 refills | Status: AC

## 2024-09-06 MED ORDER — BLOOD GLUCOSE TEST VI STRP: 1.0000 | ORAL_STRIP | Freq: Three times a day (TID) | 3 refills | Status: AC

## 2024-09-06 MED ORDER — LANCET DEVICE MISC: 1.0000 | Freq: Three times a day (TID) | 0 refills | Status: AC

## 2024-09-06 MED ORDER — BLOOD GLUCOSE MONITORING SUPPL DEVI: 1.0000 | Freq: Three times a day (TID) | 0 refills | Status: AC

## 2024-09-06 NOTE — Progress Notes (Unsigned)
 Paperwork from [place] received. Formulary for glucometer and test strips has changed to [facility]. Order for new glucometer and supplies sent to pharmacy.   Please let the patient know of the change.

## 2024-09-18 ENCOUNTER — Ambulatory Visit: Admitting: Family Medicine

## 2024-10-01 ENCOUNTER — Encounter: Admitting: Nurse Practitioner

## 2024-10-03 ENCOUNTER — Ambulatory Visit (HOSPITAL_COMMUNITY)

## 2024-10-19 ENCOUNTER — Encounter: Payer: Self-pay | Admitting: Nurse Practitioner

## 2024-10-29 ENCOUNTER — Ambulatory Visit: Admitting: Family Medicine

## 2025-01-22 ENCOUNTER — Ambulatory Visit: Payer: Self-pay
